# Patient Record
Sex: Female | Born: 1980 | Race: White | Hispanic: No | Smoking: Never smoker
Health system: Southern US, Academic
[De-identification: ages and names within clinical notes are randomized; demographics above are authoritative.]

## PROBLEM LIST (undated history)

## (undated) DIAGNOSIS — G43909 Migraine, unspecified, not intractable, without status migrainosus: Secondary | ICD-10-CM

## (undated) DIAGNOSIS — K760 Fatty (change of) liver, not elsewhere classified: Secondary | ICD-10-CM

## (undated) DIAGNOSIS — J45909 Unspecified asthma, uncomplicated: Secondary | ICD-10-CM

## (undated) DIAGNOSIS — I219 Acute myocardial infarction, unspecified: Secondary | ICD-10-CM

## (undated) DIAGNOSIS — E785 Hyperlipidemia, unspecified: Secondary | ICD-10-CM

## (undated) DIAGNOSIS — I10 Essential (primary) hypertension: Secondary | ICD-10-CM

## (undated) DIAGNOSIS — M1990 Unspecified osteoarthritis, unspecified site: Secondary | ICD-10-CM

## (undated) DIAGNOSIS — E119 Type 2 diabetes mellitus without complications: Secondary | ICD-10-CM

## (undated) DIAGNOSIS — M3500 Sicca syndrome, unspecified: Secondary | ICD-10-CM

## (undated) HISTORY — DX: Migraine, unspecified, not intractable, without status migrainosus: G43.909

## (undated) HISTORY — DX: Hyperlipidemia, unspecified: E78.5

## (undated) HISTORY — PX: HX SHOULDER ARTHROSCOPY: SHX128

## (undated) HISTORY — PX: HX GALL BLADDER SURGERY/CHOLE: SHX55

## (undated) HISTORY — PX: HX TUBAL LIGATION: SHX77

## (undated) HISTORY — DX: Type 2 diabetes mellitus without complications: E11.9

## (undated) HISTORY — DX: Acute myocardial infarction, unspecified: I21.9

## (undated) HISTORY — DX: Essential (primary) hypertension: I10

## (undated) HISTORY — PX: HX TONSILLECTOMY: SHX27

## (undated) HISTORY — DX: Unspecified asthma, uncomplicated: J45.909

## (undated) HISTORY — PX: HX HYSTERECTOMY: SHX81

---
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
## 2012-04-19 ENCOUNTER — Ambulatory Visit (INDEPENDENT_AMBULATORY_CARE_PROVIDER_SITE_OTHER): Payer: Self-pay | Admitting: Rheumatology

## 2012-04-19 NOTE — Telephone Encounter (Signed)
Message copied by Drue Novel on Thu Apr 19, 2012 10:21 AM  ------       Message from: Celene Skeen D       Created: Thu Apr 19, 2012  9:47 AM       Regarding: Records         >> Princella Pellegrini [facility] 04/19/2012 09:47 AM       Dr. Tenna Child,              Per pt she is wanting to make sure you have received her records from Dr. Sherilyn Banker and may call her mother's cell at [phone] if there are any problems.  ------

## 2012-04-23 ENCOUNTER — Encounter (INDEPENDENT_AMBULATORY_CARE_PROVIDER_SITE_OTHER): Payer: Self-pay | Admitting: Rheumatology

## 2012-04-23 NOTE — Progress Notes (Signed)
Records received from [facility] center. Patient notified and confirmed appointment with dr. Tenna Child on 04-25-12.  Honor Loh, LPN [id number], 11:24 AM

## 2012-04-25 ENCOUNTER — Ambulatory Visit (INDEPENDENT_AMBULATORY_CARE_PROVIDER_SITE_OTHER): Payer: Medicaid Other | Admitting: Rheumatology

## 2012-07-26 ENCOUNTER — Encounter (INDEPENDENT_AMBULATORY_CARE_PROVIDER_SITE_OTHER): Payer: Self-pay | Admitting: Rheumatology

## 2012-07-26 ENCOUNTER — Ambulatory Visit (INDEPENDENT_AMBULATORY_CARE_PROVIDER_SITE_OTHER): Payer: Medicaid Other | Admitting: Rheumatology

## 2012-07-26 ENCOUNTER — Ambulatory Visit
Admission: RE | Admit: 2012-07-26 | Discharge: 2012-07-26 | Disposition: A | Discharge status: Home / Self Care | Payer: Medicaid Other | Source: Ambulatory Visit | Attending: Rheumatology | Admitting: Rheumatology

## 2012-07-26 ENCOUNTER — Ambulatory Visit (HOSPITAL_BASED_OUTPATIENT_CLINIC_OR_DEPARTMENT_OTHER): Payer: Medicaid Other | Admitting: Rheumatology

## 2012-07-26 VITALS — BP 133/98 | HR 90 | Temp 97.4°F | Ht 69.53 in | Wt 246.5 lb

## 2012-07-26 DIAGNOSIS — R894 Abnormal immunological findings in specimens from other organs, systems and tissues: Secondary | ICD-10-CM | POA: Insufficient documentation

## 2012-07-26 DIAGNOSIS — M2550 Pain in unspecified joint: Secondary | ICD-10-CM | POA: Insufficient documentation

## 2012-07-26 DIAGNOSIS — G8929 Other chronic pain: Secondary | ICD-10-CM | POA: Insufficient documentation

## 2012-07-26 DIAGNOSIS — M25519 Pain in unspecified shoulder: Secondary | ICD-10-CM | POA: Insufficient documentation

## 2012-07-26 LAB — URINALYSIS, MACROSCOPIC AND MICROSCOPIC
BILIRUBIN: NEGATIVE
BLOOD: NEGATIVE
GLUCOSE: 30 mg/dL — AB
KETONES: NEGATIVE mg/dL
LEUKOCYTES: NEGATIVE
NITRITE: NEGATIVE
PH URINE: 5 (ref 5.0–8.0)
RBC'S: <1 /HPF (ref 0–4)
SPECIFIC GRAVITY, URINE: 1.021 (ref 1.005–1.030)
UROBILINOGEN: NORMAL mg/dL
WBC'S: 1 /HPF (ref 0–6)

## 2012-07-26 LAB — COMPREHENSIVE METABOLIC PANEL, NON-FASTING
ALBUMIN: 4 g/dL (ref 3.5–4.8)
ALKALINE PHOSPHATASE: 67 U/L (ref 38–126)
ALT (SGPT): 13 U/L (ref 7–45)
ANION GAP: 10 mmol/L (ref 5–16)
AST (SGOT): 13 U/L (ref 8–41)
BILIRUBIN, TOTAL: 0.5 mg/dL (ref 0.3–1.3)
BUN/CREAT RATIO: 17 (ref 6–22)
BUN: 11 mg/dL (ref 6–20)
CALCIUM: 9.5 mg/dL (ref 8.5–10.4)
CARBON DIOXIDE: 26 mmol/L (ref 22–32)
CHLORIDE: 103 mmol/L (ref 96–111)
CREATININE: 0.64 mg/dL (ref 0.49–1.10)
ESTIMATED GLOMERULAR FILTRATION RATE: >59 ml/min/1.73m2 (ref >59)
GLUCOSE,NONFAST: 140 mg/dL — ABNORMAL HIGH (ref 65–139)
POTASSIUM: 3.8 mmol/L (ref 3.5–5.1)
SODIUM: 139 mmol/L (ref 136–145)
TOTAL PROTEIN: 7.7 g/dL (ref 6.4–8.3)

## 2012-07-26 LAB — SEDIMENTATION RATE: SEDIMENTATION RATE: 25 mm/hr — ABNORMAL HIGH (ref 0–20)

## 2012-07-26 LAB — CBC/DIFF
BASOPHILS: 1 %
BASOS ABS: 0.085 THOU/uL (ref 0.0–0.2)
EOS ABS: 0.26 10*3/uL (ref 0.0–0.5)
EOSINOPHIL: 2 %
HCT: 37.3 % (ref 33.5–45.2)
HGB: 11.9 g/dL (ref 11.2–15.2)
LYMPHOCYTES: 32 %
LYMPHS ABS: 3.997 10*3/uL (ref 1.0–4.8)
MCH: 25.4 pg — ABNORMAL LOW (ref 27.4–33.0)
MCHC: 32 g/dL — ABNORMAL LOW (ref 32.5–35.8)
MCV: 79.4 fL (ref 78–100)
MONOCYTES: 6 %
MONOS ABS: 0.748 10*3/uL (ref 0.3–1.0)
MPV: 7.8 fL (ref 7.5–11.5)
PLATELET COUNT: 394 THOU/uL (ref 140–450)
PMN ABS: 7.277 THOU/uL (ref 1.5–7.7)
PMN'S: 59 %
RBC: 4.7 MIL/uL (ref 3.63–4.92)
RDW: 15.4 % — ABNORMAL HIGH (ref 12.0–15.0)
WBC: 12.4 10*3/uL — ABNORMAL HIGH (ref 3.5–11.0)

## 2012-07-27 LAB — C4 COMPLEMENT, SERUM: C4: 35 mg/dL (ref 12–39)

## 2012-07-27 LAB — C3 COMPLEMENT, SERUM: C3: 204 mg/dL — ABNORMAL HIGH (ref 81–157)

## 2012-07-27 LAB — HEP-2 SUBSTRATE ANTINUCLEAR ANTIBODIES (ANA), SERUM: ANTI-NUCLEAR AB: NEGATIVE

## 2012-07-28 LAB — DNA DOUBLE STRANDED (DSDNA) ANTIBODIES WITH REFLEX, IGG, SERUM: DNA DOUBLE-STRANDED AB, IGG: <12.3

## 2012-07-28 LAB — RNP ANTIBODIES, IGG, SERUM: RNP ANTIBODIES, IGG, SERUM: <0.2

## 2012-07-28 LAB — SM (SMITH) ANTIBODIES, IGG, SERUM: SM (SMITH) ANTIBODIES, IGG, SERUM: <0.2

## 2012-07-28 LAB — CYCLIC CITRULLINATED PEPTIDE ANTIBODIES, IGG, SERUM: CYCLIC CITRULLINATED PEPTIDE ANTIBODIES, IGG, SERUM: <15.6

## 2012-07-28 LAB — SS-A AND SS-B ANTIBODIES, IGG, SERUM
SS-A/RO ANTIBODIES, IGG, SERUM: <0.2
SS-B/LA ANTIBODIES, IGG, SERUM: 2.6 — ABNORMAL HIGH

## 2012-07-30 LAB — RHEUMATOID FACTOR, SERUM: RHEUMATOID FACTOR, SERUM: <15

## 2012-08-02 ENCOUNTER — Encounter (INDEPENDENT_AMBULATORY_CARE_PROVIDER_SITE_OTHER): Payer: Self-pay | Admitting: Surgical

## 2012-08-02 ENCOUNTER — Telehealth (INDEPENDENT_AMBULATORY_CARE_PROVIDER_SITE_OTHER): Payer: Self-pay | Admitting: Surgical

## 2012-08-02 ENCOUNTER — Ambulatory Visit (INDEPENDENT_AMBULATORY_CARE_PROVIDER_SITE_OTHER): Payer: Self-pay | Admitting: Surgical

## 2012-08-02 NOTE — Telephone Encounter (Signed)
 Message copied by Shean Gerding JO on Thu Aug 02, 2012  2:18 PM  ------       Message from: [place], ARIZONA MARIE       Created: Thu Aug 02, 2012 12:00 PM         >> MONT HASTE 08/02/2012 11:55 AM       Mirka Barbone       Pt is calling back stated she spoke with you earlier regarding her test results but she forgot to ask why you think her legs are swelling.Please call pt back [phone]  ------

## 2012-08-02 NOTE — Telephone Encounter (Signed)
Called patient at home number  Left message I called     Called mobile number   Gave results  Positive SSB   Would rec minor salivary lip biopsy to further evaluate for possible sjogrens, but negative SSA and ANA  Had trace protein in urine - states has had before was told due to diabetes  Want to have referral to EN[facility] closer to home  Will send PCP letter with results and recommendations  Otherwise, this doe not represent lupus or RA   She understands

## 2012-08-02 NOTE — Telephone Encounter (Signed)
 Please call patient back  She needs to follow up with her PCP  I cannot give her a rheumatologic cause of her leg swelling  Thank you

## 2012-08-02 NOTE — Telephone Encounter (Signed)
Called pt and relayed Holly's message. Pt will follow up with PCP. Instructed pt to call with any further questions or concerns.

## 2012-08-06 ENCOUNTER — Ambulatory Visit (INDEPENDENT_AMBULATORY_CARE_PROVIDER_SITE_OTHER): Payer: Medicaid Other | Admitting: Rheumatology

## 2016-04-25 ENCOUNTER — Ambulatory Visit (INDEPENDENT_AMBULATORY_CARE_PROVIDER_SITE_OTHER): Payer: Medicaid Other | Admitting: Anesthesiology

## 2016-05-09 ENCOUNTER — Ambulatory Visit (INDEPENDENT_AMBULATORY_CARE_PROVIDER_SITE_OTHER): Payer: Medicaid Other | Admitting: Family

## 2016-05-31 ENCOUNTER — Ambulatory Visit (INDEPENDENT_AMBULATORY_CARE_PROVIDER_SITE_OTHER): Payer: Medicaid Other | Admitting: Family

## 2018-06-29 IMAGING — US ABD LIMITED
1 series · 14 of 25 positions shown · non-contrast
Comparison: 11/22/2016.

EXAM:  TOMOY[place]I PROFESSIONAL READ ABD U/S LMTD
INDICATION: K 76.0.

[Series 1: abd limited · 14 of 50 slices shown]
[im 1/50]
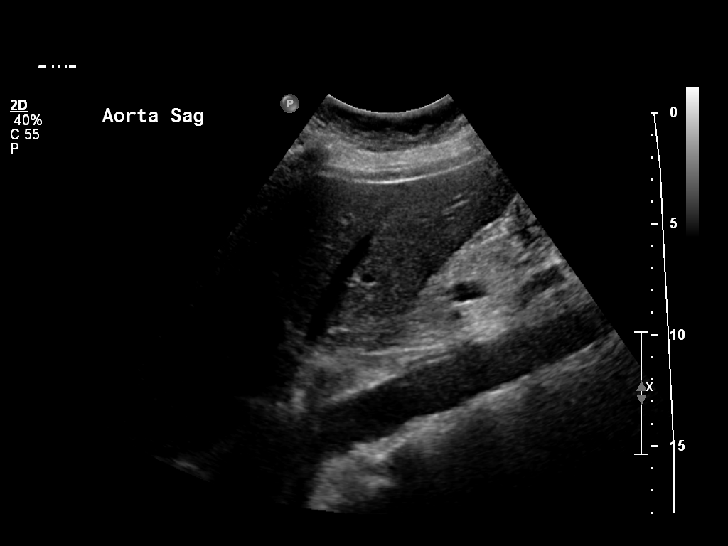
[im 5/50]
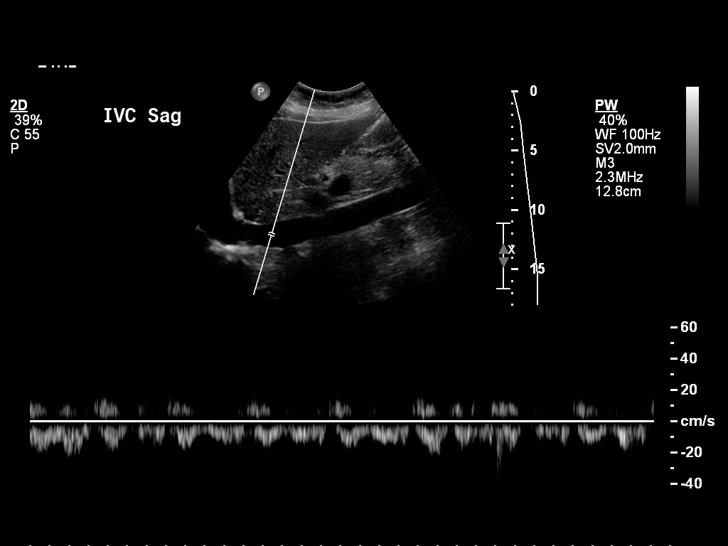
[im 9/50]
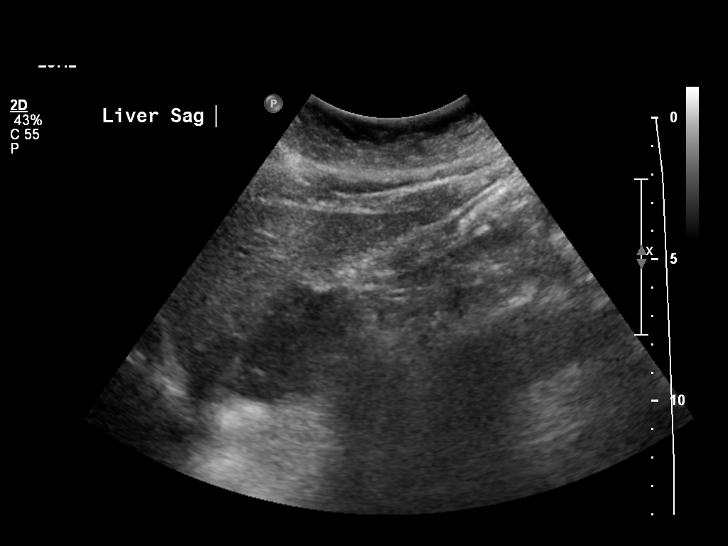
[im 13/50]
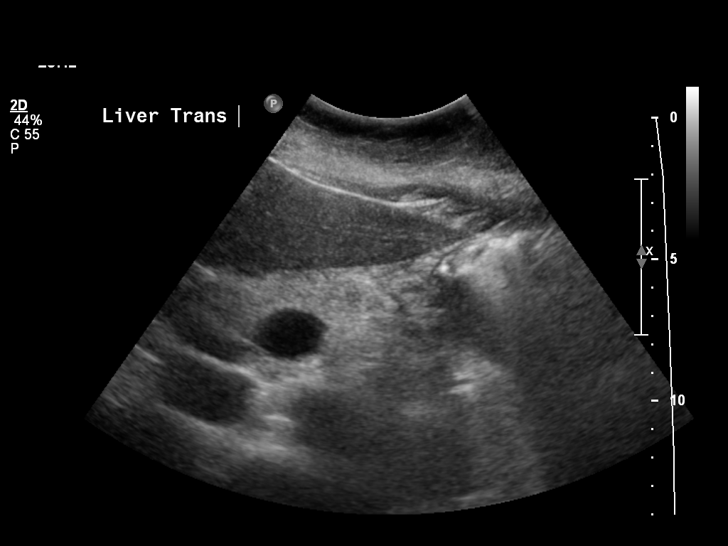
[im 17/50]
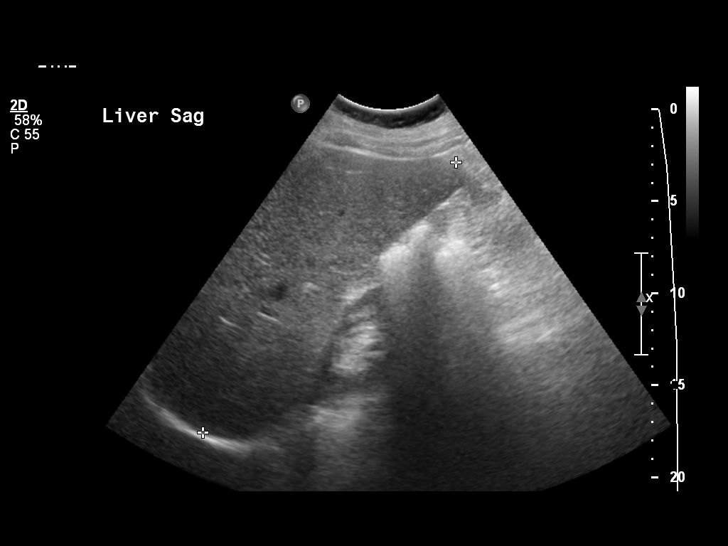
[im 19/50]
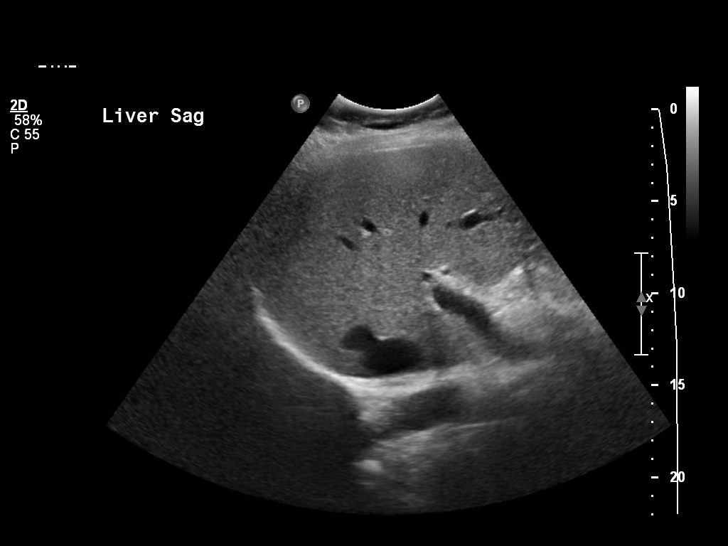
[im 23/50]
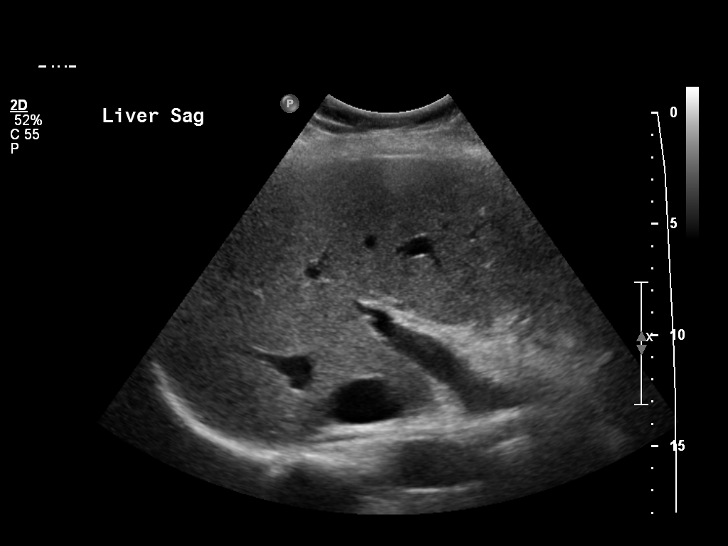
[im 27/50]
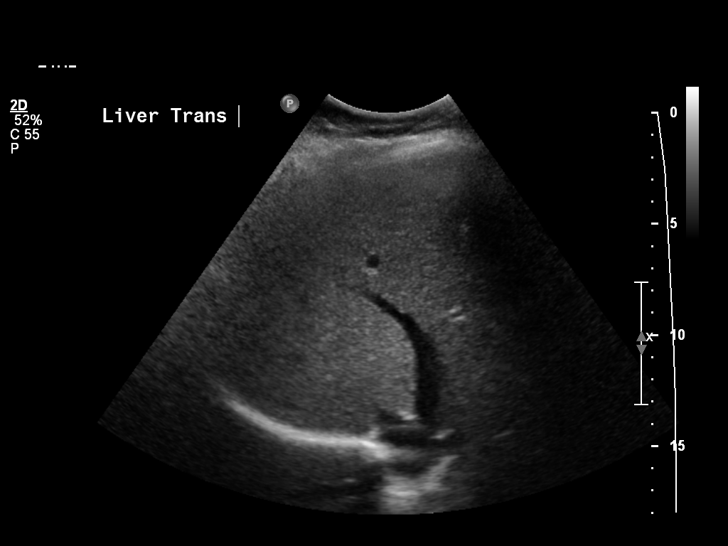
[im 31/50]
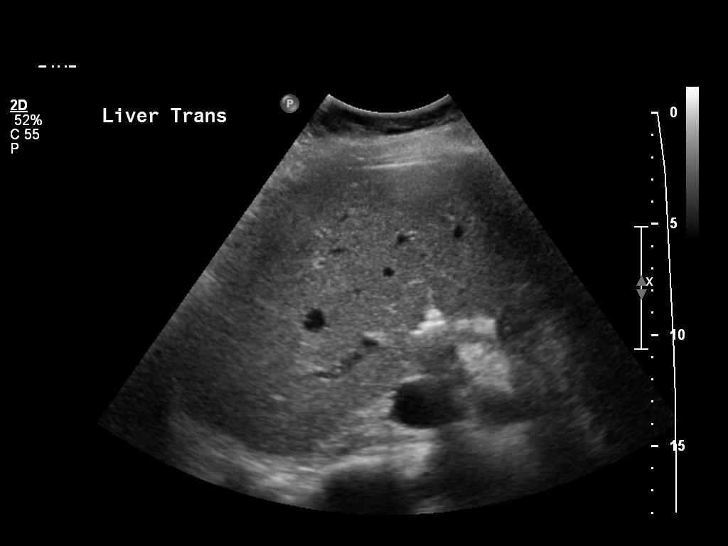
[im 33/50]
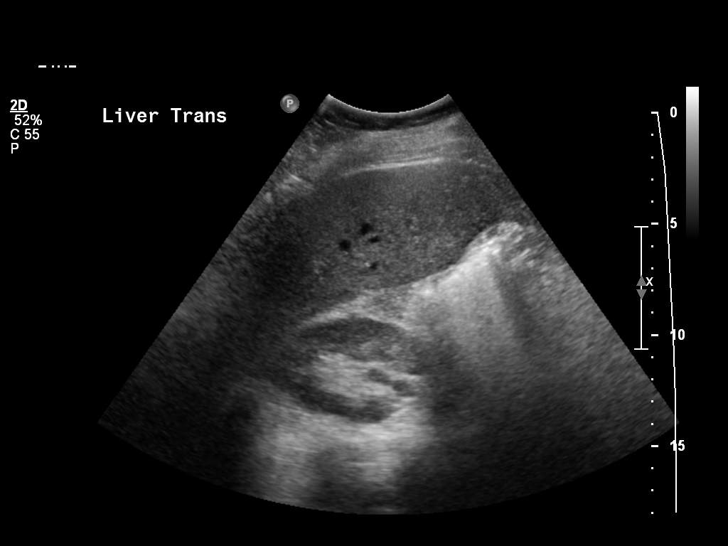
[im 37/50]
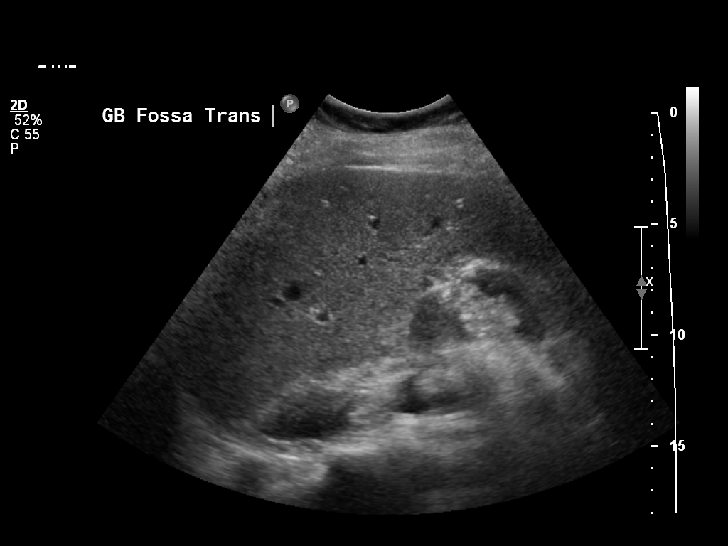
[im 41/50]
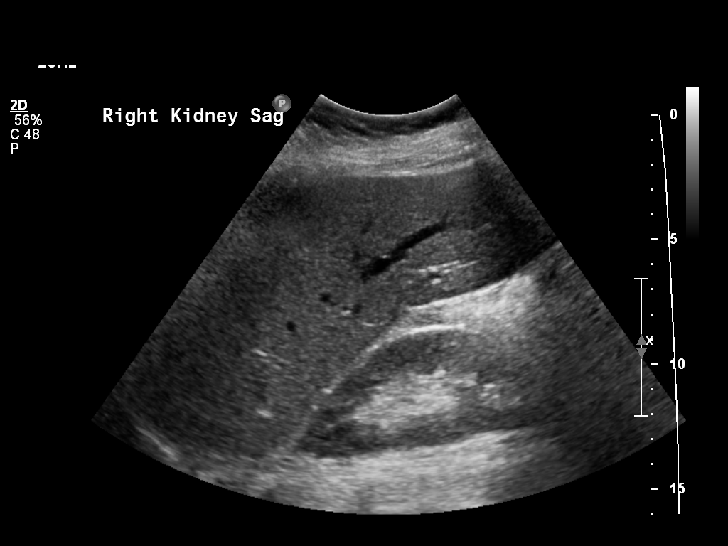
[im 45/50]
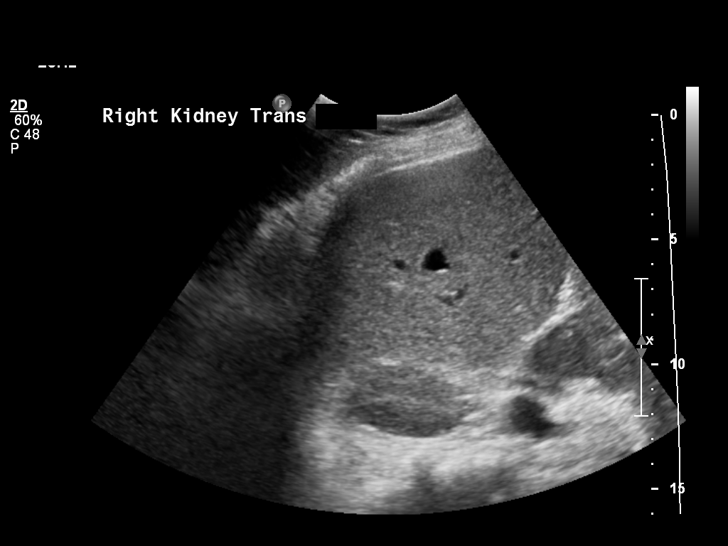
[im 50/50]
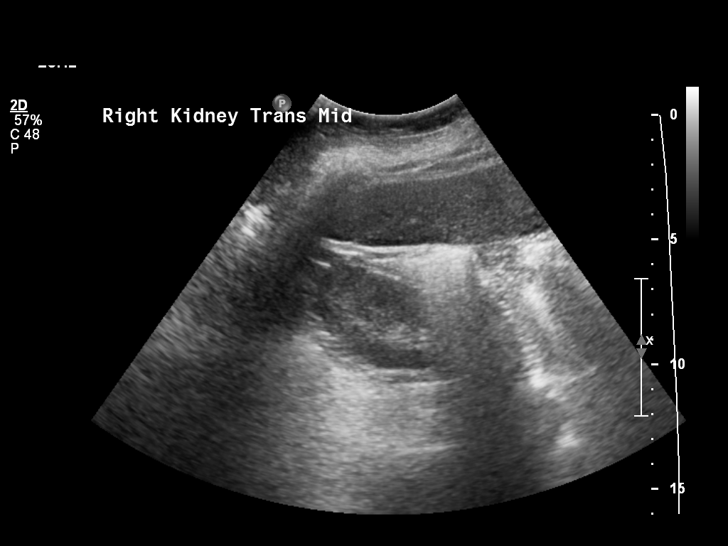

[14 of 25 positions shown; findings below may reference images not displayed]

FINDINGS: Liver is fatty and enlarged measuring 20 cm in maximum sagittal dimension. Fatty infiltration limits evaluation for focal hepatic mass. There is no intrahepatic biliary ductal dilatation. Common bile duct measures 7 mm, likely due to prior cholecystectomy. Pancreas is incompletely visualized due to artifact from overlying bowel gas. Right kidney measures 11.5 cm and is normal.

Visualized abdominal aorta is without aneurysmal dilatation. IVC is normal. Portal vein measures 13 mm in diameter and demonstrates hepatopetal flow. Hepatic veins are also patent. There is no ascites.
IMPRESSION: 1. Fatty and enlarged liver. 

2. Prior cholecystectomy. 

3. Pancreas incompletely visualized due to artifact from overlying bowel gas.

## 2019-03-11 IMAGING — US ABD LIMITED
1 series · 14 of 25 positions shown · non-contrast
Comparison: 06/29/2018.

EXAM:  BILLIE PROFESSIONAL READ ABD U/S LMTD
INDICATION: Fatty liver.

[Series 1: abd limited · 14 of 48 slices shown]
[im 1/48]
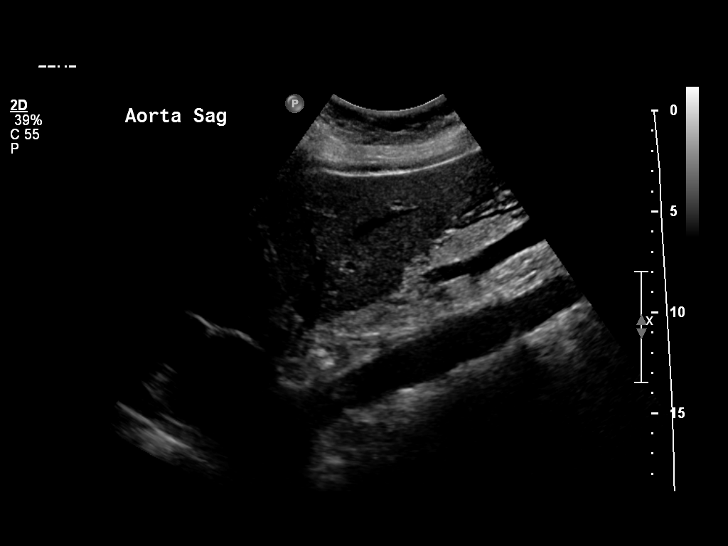
[im 4/48]
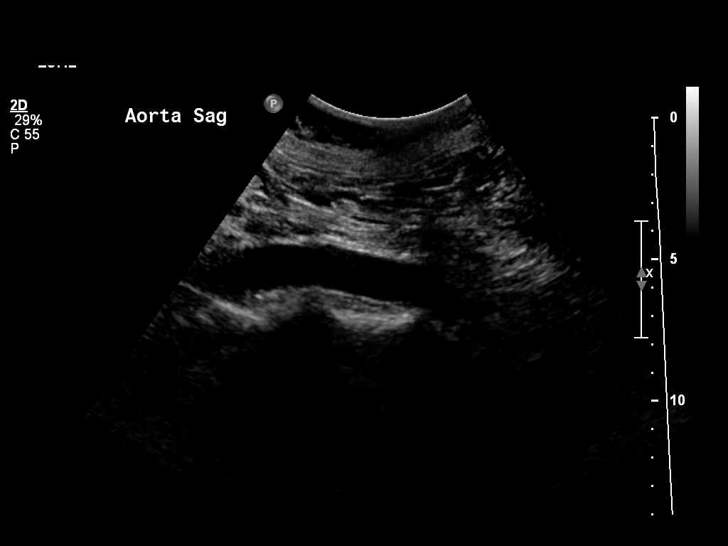
[im 8/48]
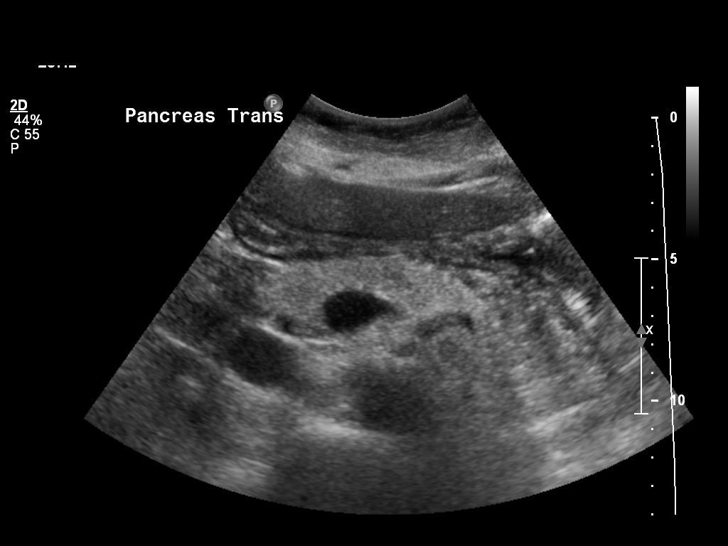
[im 12/48]
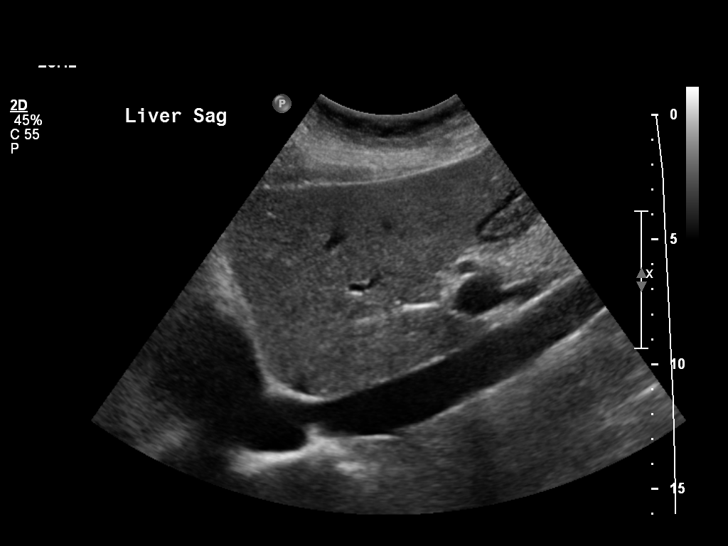
[im 16/48]
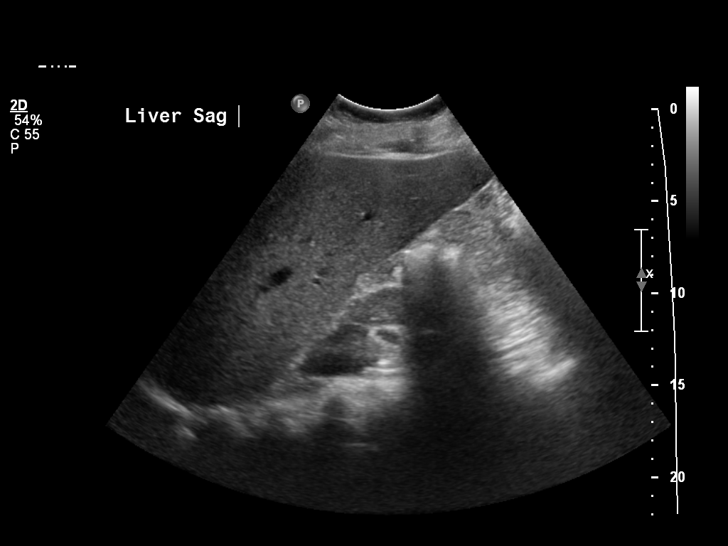
[im 18/48]
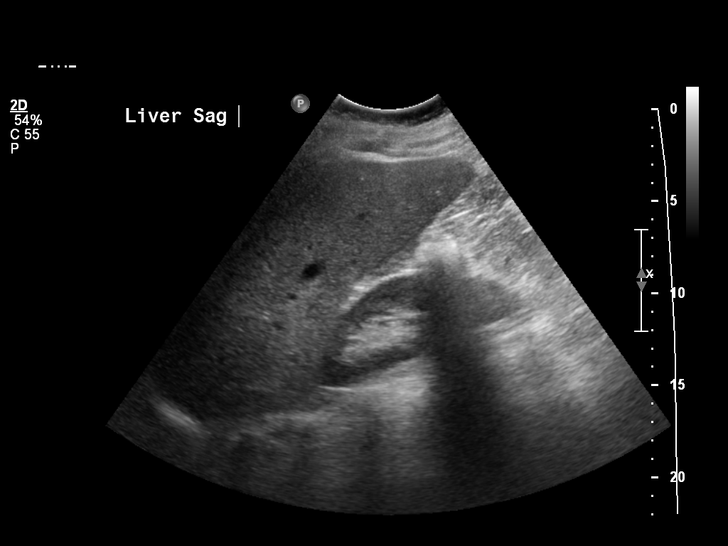
[im 22/48]
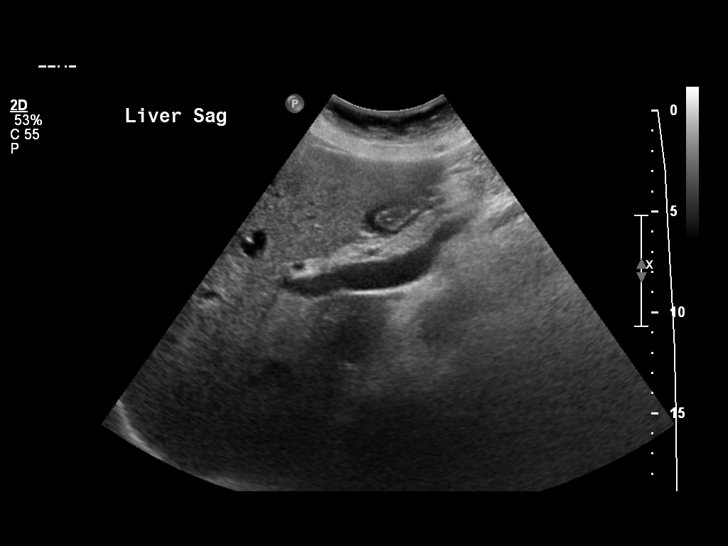
[im 26/48]
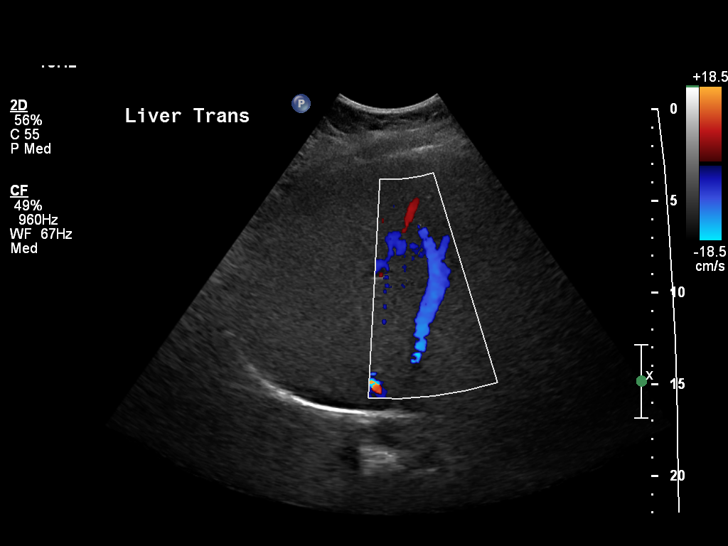
[im 30/48]
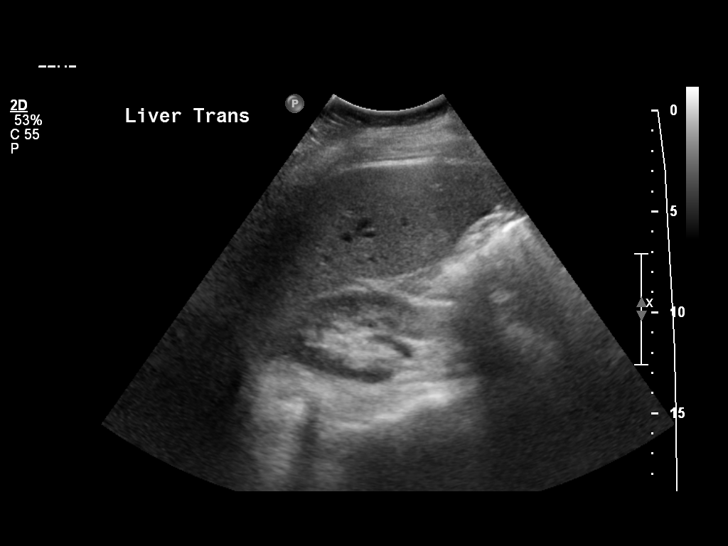
[im 32/48]
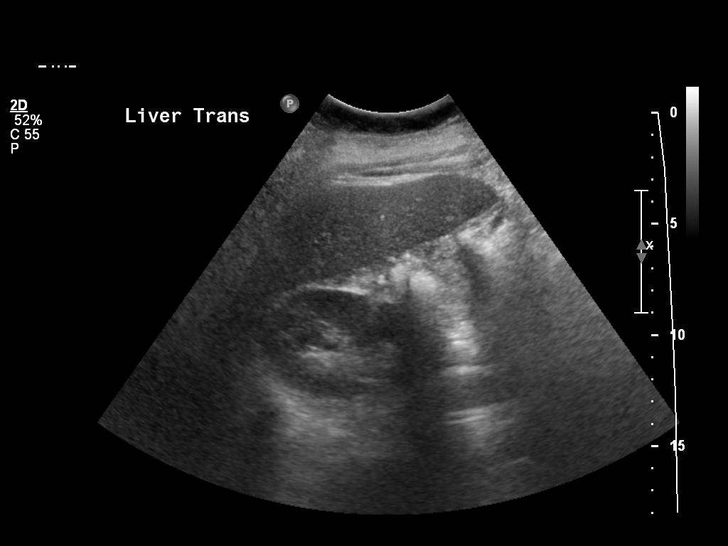
[im 36/48]
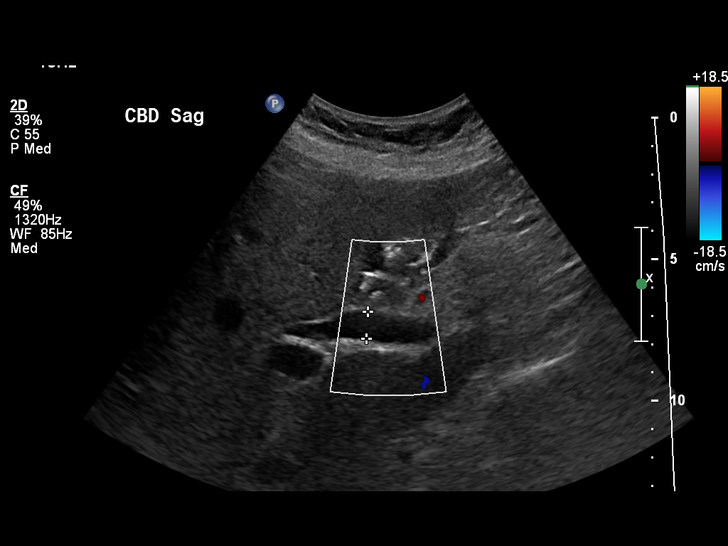
[im 40/48]
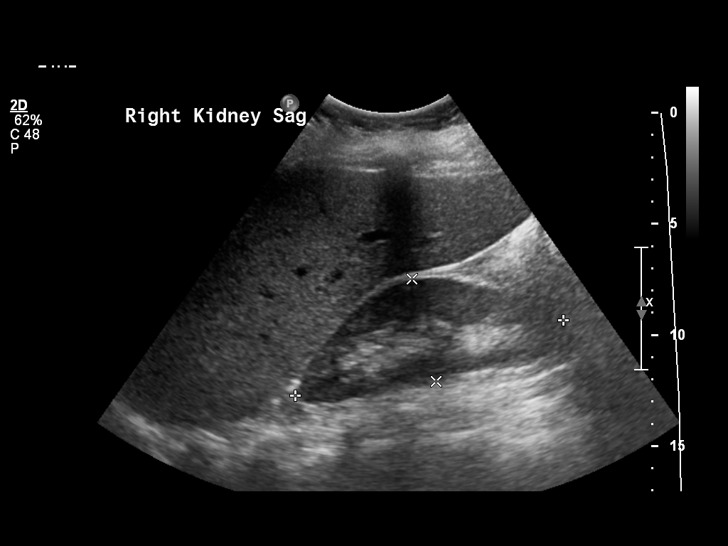
[im 44/48]
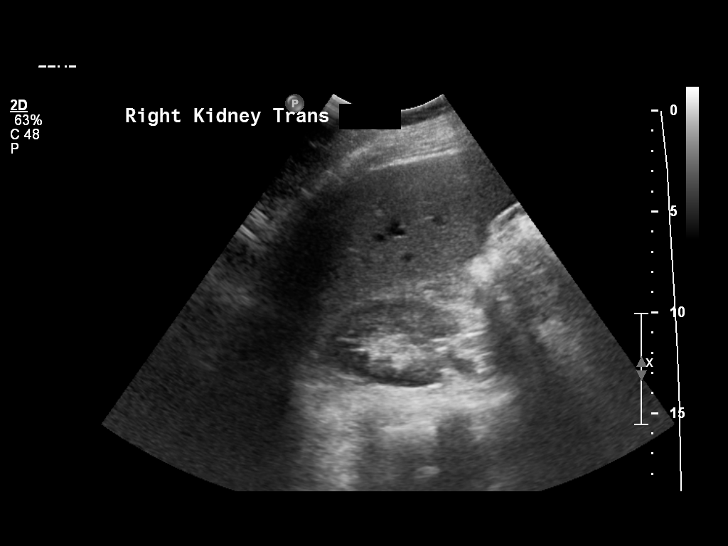
[im 48/48]
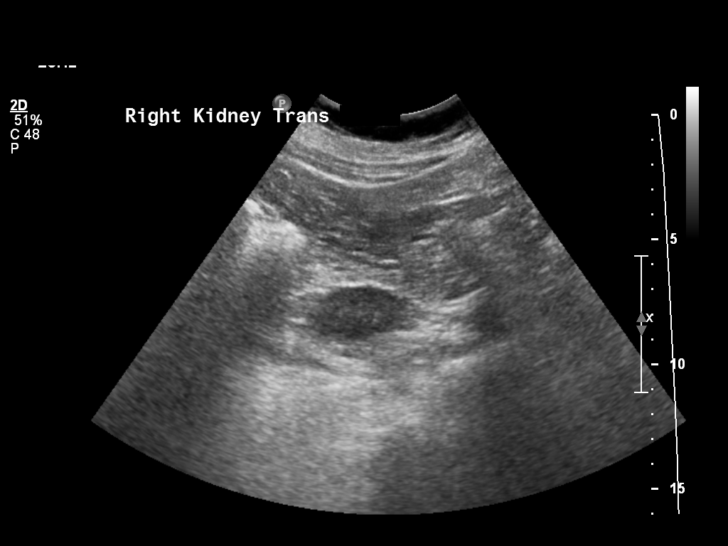

[14 of 25 positions shown; findings below may reference images not displayed]

FINDINGS: Liver is fatty and enlarged measuring 20.5 cm in maximum sagittal dimension. Fatty infiltration limits evaluation for focal hepatic mass. There is no intrahepatic biliary ductal dilatation. Common bile duct measures 9.5 mm, likely due to prior cholecystectomy. Pancreas is incompletely visualized due to artifact from overlying bowel gas. Right kidney measures 12.5 cm and is normal.

Visualized abdominal aorta is without aneurysmal dilatation. IVC is normal. Portal vein measures 13 mm in diameter and demonstrates hepatopetal flow. Hepatic veins are also patent. There is no ascites.
IMPRESSION: 1. Fatty and enlarged liver. 

2. Prior cholecystectomy. 

3. Pancreas incompletely visualized due to artifact from overlying bowel gas.

## 2019-08-30 IMAGING — US ABD LIMITED
1 series · 14 of 25 positions shown · non-contrast
Comparison: 03/11/2019.

EXAM:  CLEMENT[place] PROFESSIONAL READ ABD U/S LMTD
INDICATION: Fatty liver.

[Series 1: abd limited · 14 of 51 slices shown]
[im 1/51]
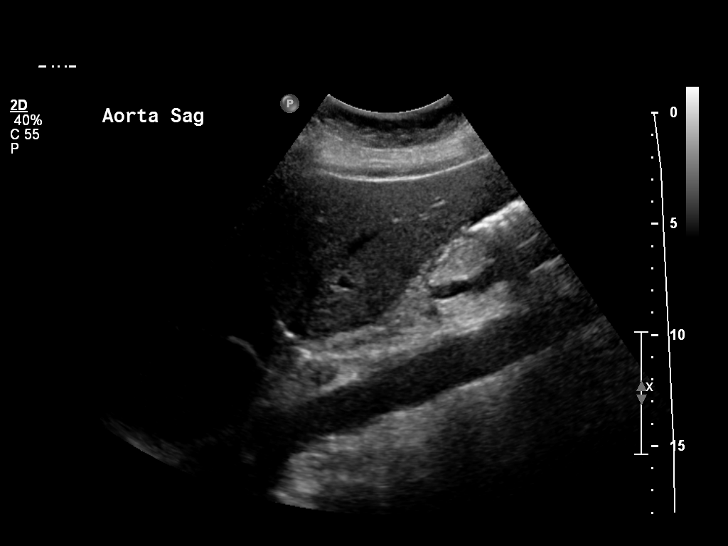
[im 5/51]
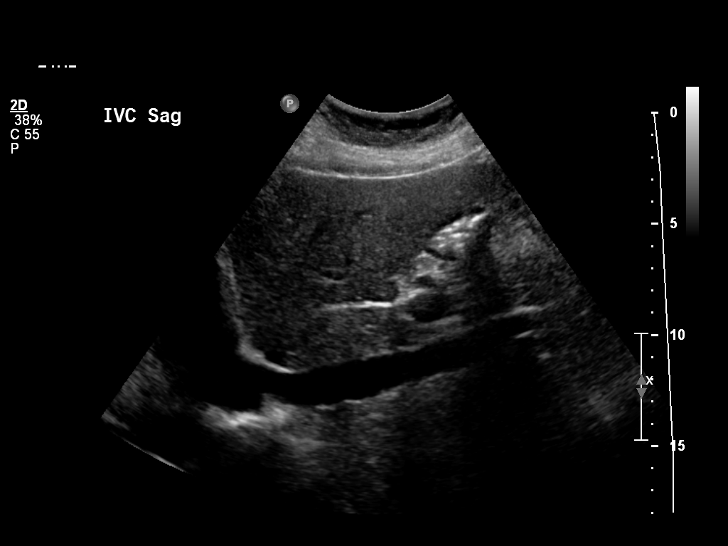
[im 9/51]
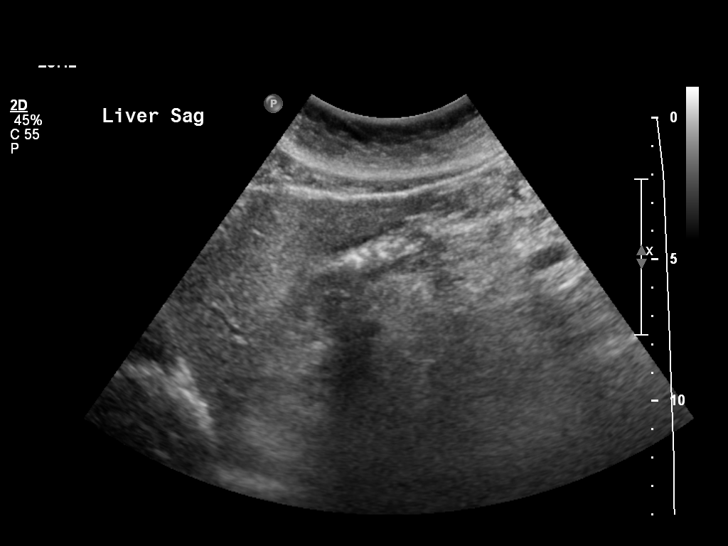
[im 13/51]
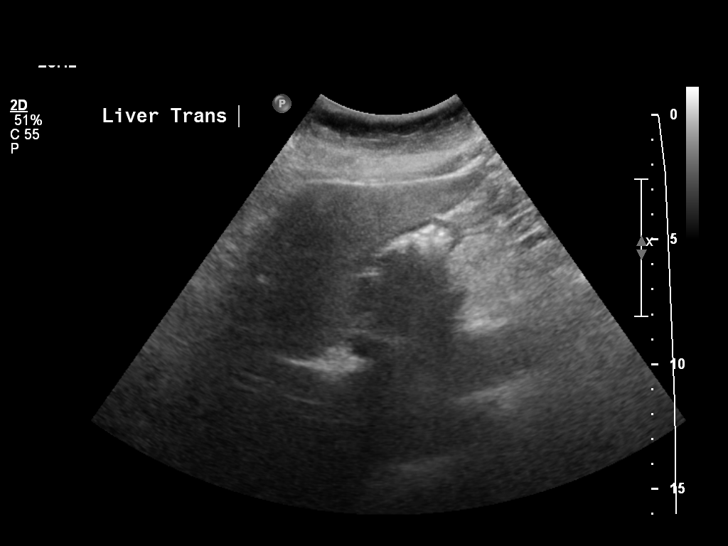
[im 17/51]
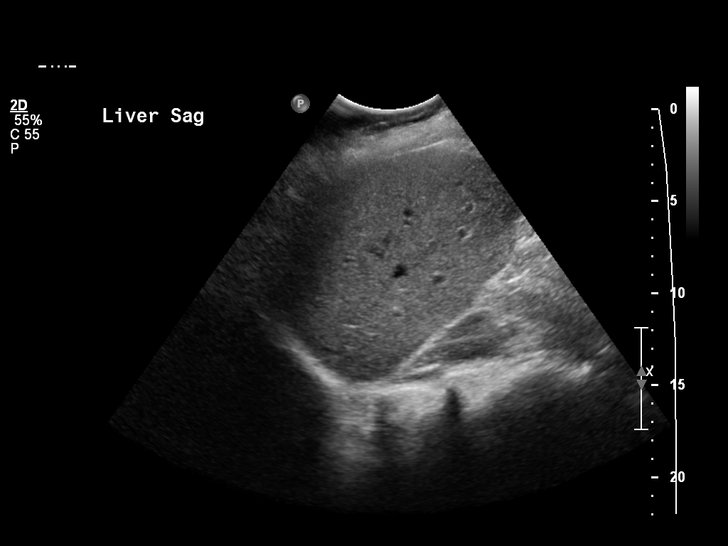
[im 19/51]
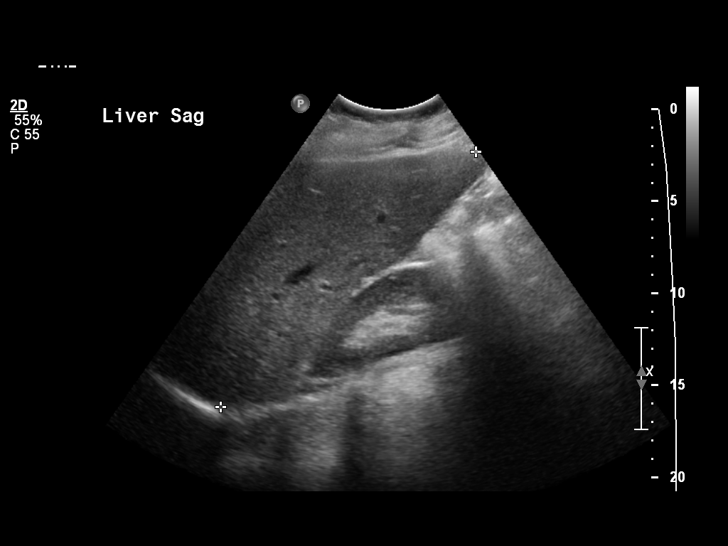
[im 23/51]
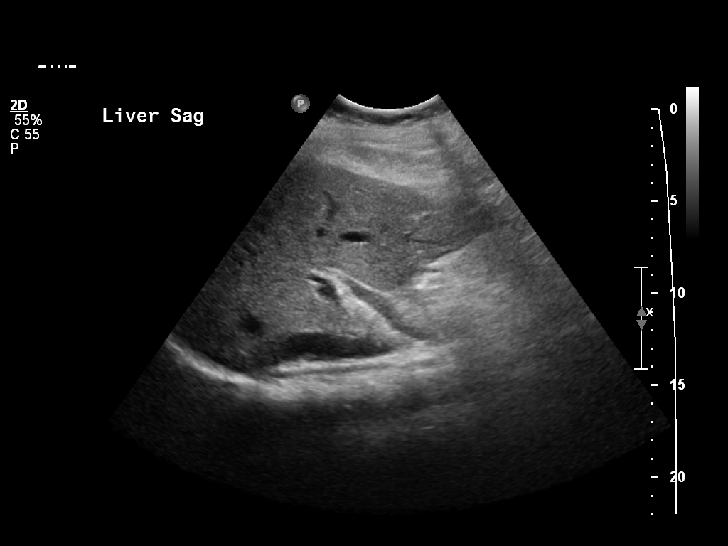
[im 28/51]
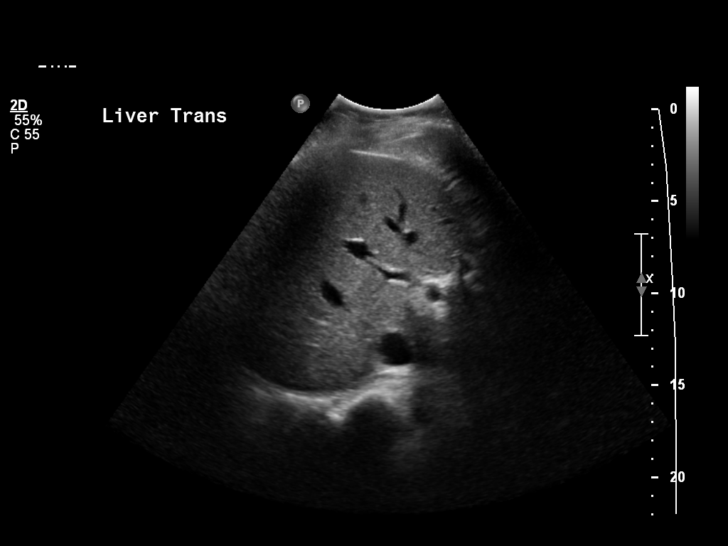
[im 32/51]
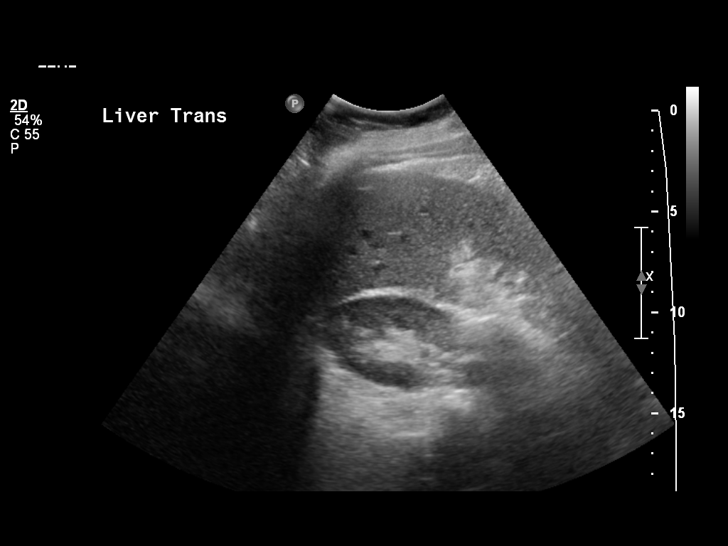
[im 34/51]
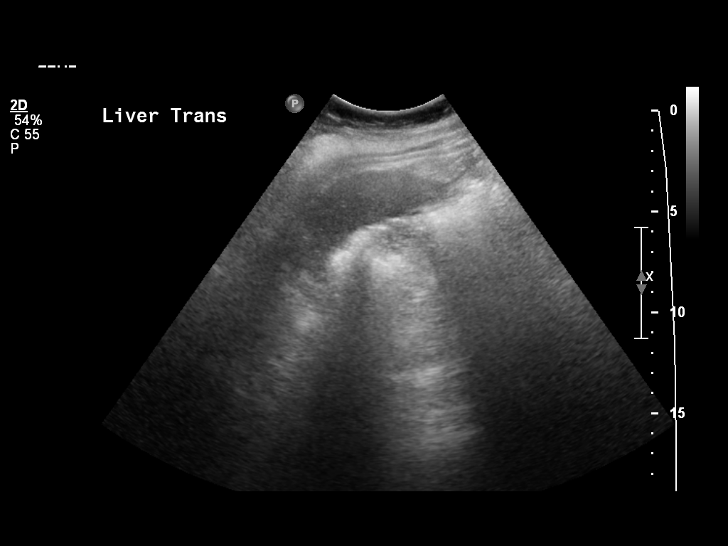
[im 38/51]
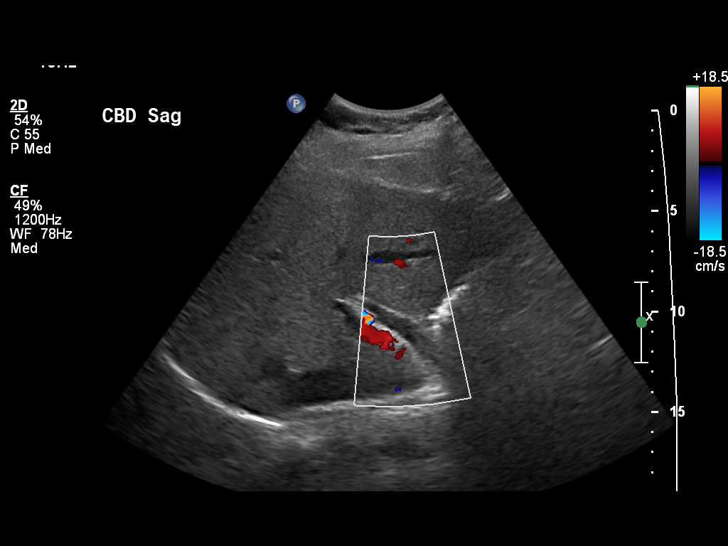
[im 42/51]
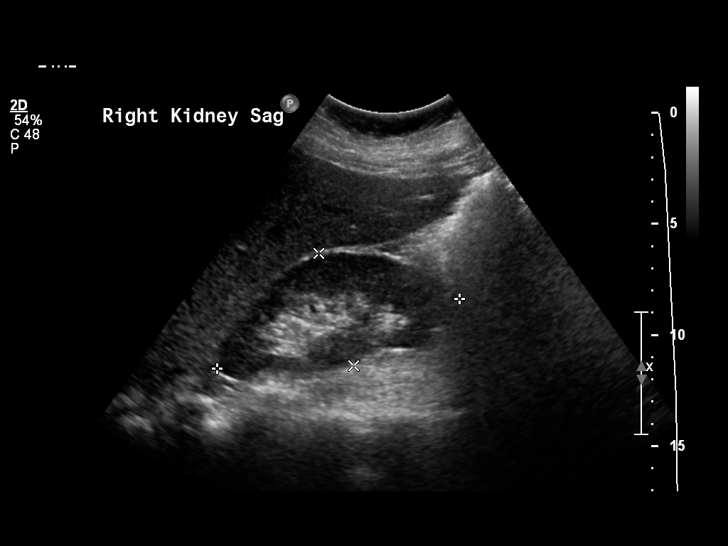
[im 46/51]
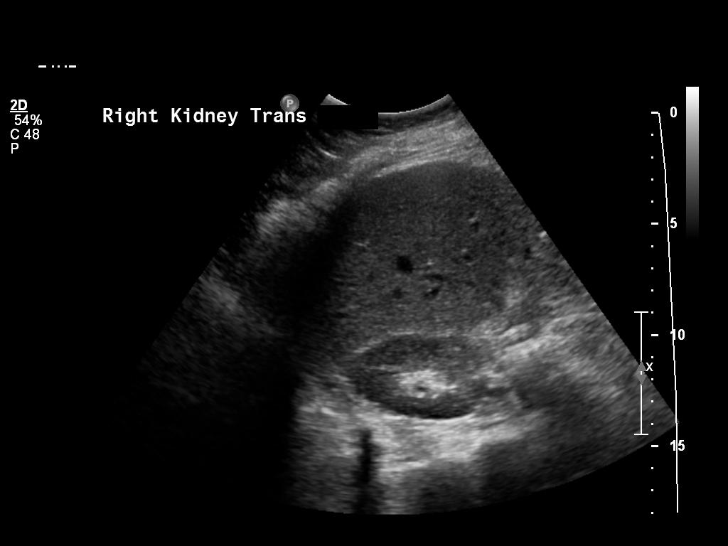
[im 51/51]
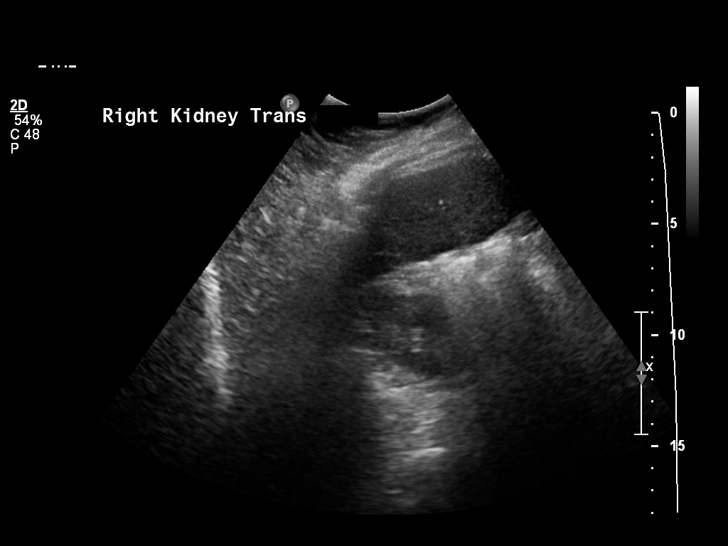

[14 of 25 positions shown; findings below may reference images not displayed]

FINDINGS: Liver is fatty and enlarged measuring 19.5 cm in maximum sagittal dimension. Fatty infiltration limits evaluation for focal hepatic mass. There is no intra or extrahepatic biliary ductal dilatation. Common bile duct measures 6.5 mm. Gallbladder is surgically absent. Pancreas is incompletely visualized due to artifact from overlying bowel gas. Right kidney measures 11.5 cm and is normal.

Visualized abdominal aorta is without aneurysmal dilatation. IVC is normal. Portal vein measures 13 mm in diameter and demonstrates hepatopetal flow. Hepatic veins are also patent. There is no ascites.
IMPRESSION: 1. Fatty and enlarged liver. 

2. Prior cholecystectomy. 

3. Pancreas incompletely visualized due to artifact from overlying bowel gas.

## 2020-03-10 IMAGING — US ABD LIMITED
1 series · 14 of 25 positions shown · non-contrast
Comparison: 08/30/2019.

﻿EXAM:  SOMEKA[place] PROFESSIONAL READ ABD U/S LMTD
INDICATION: Fatty liver.

[Series 1: abd limited · 14 of 50 slices shown]
[im 1/50]
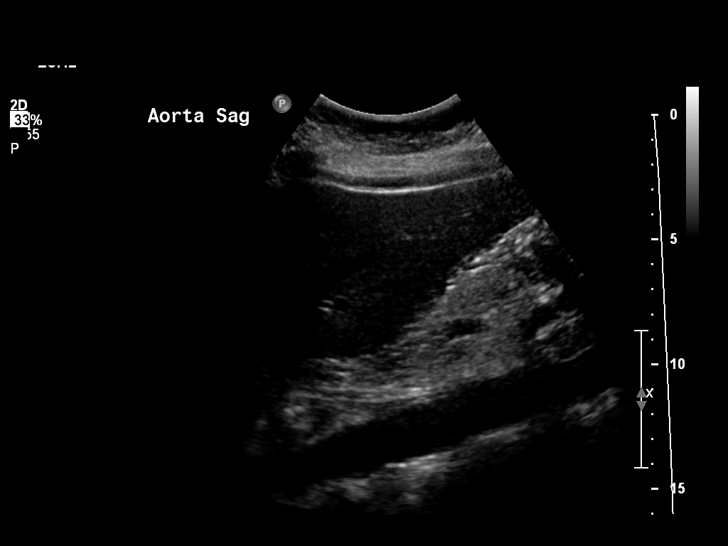
[im 5/50]
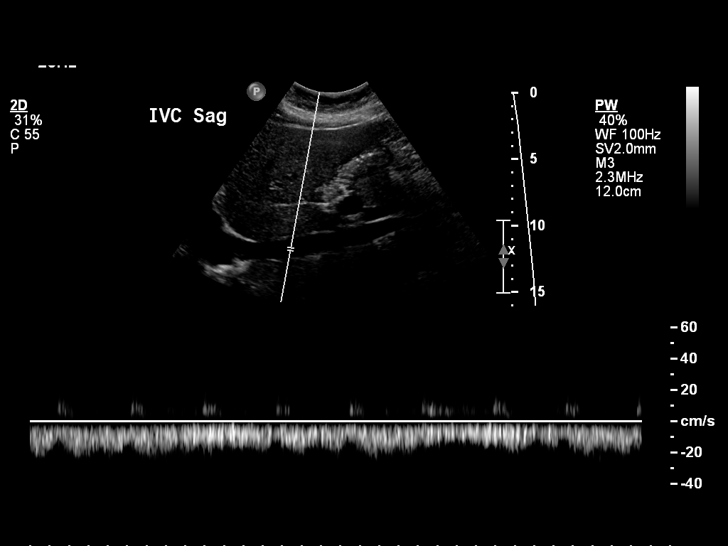
[im 9/50]
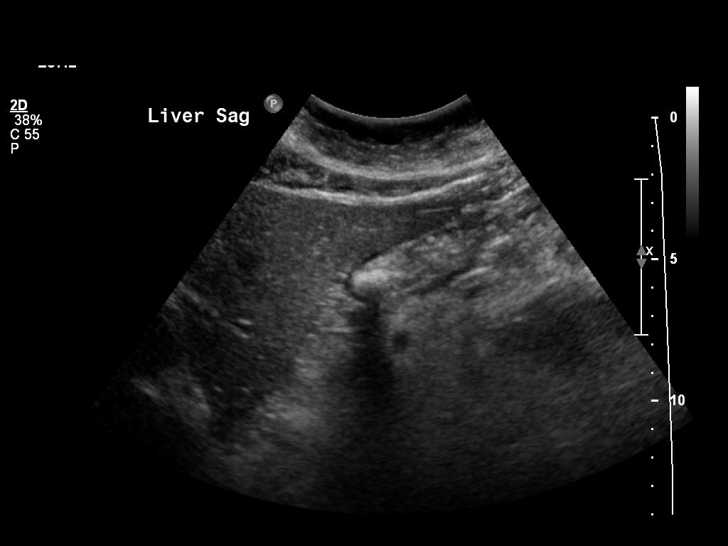
[im 13/50]
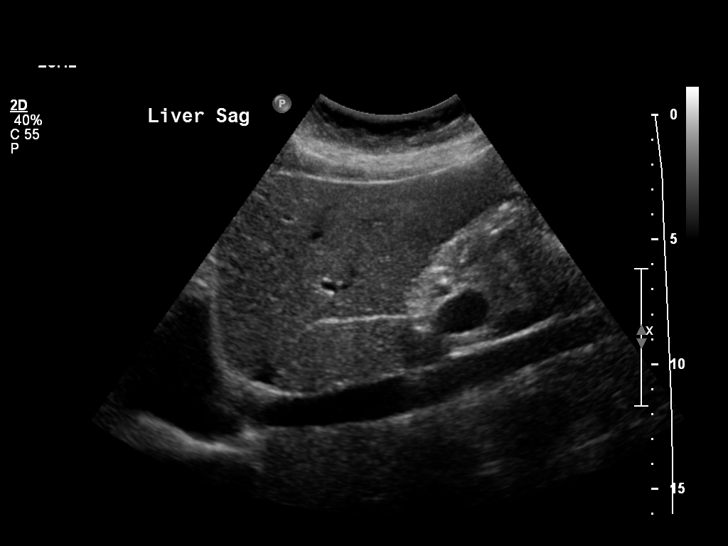
[im 17/50]
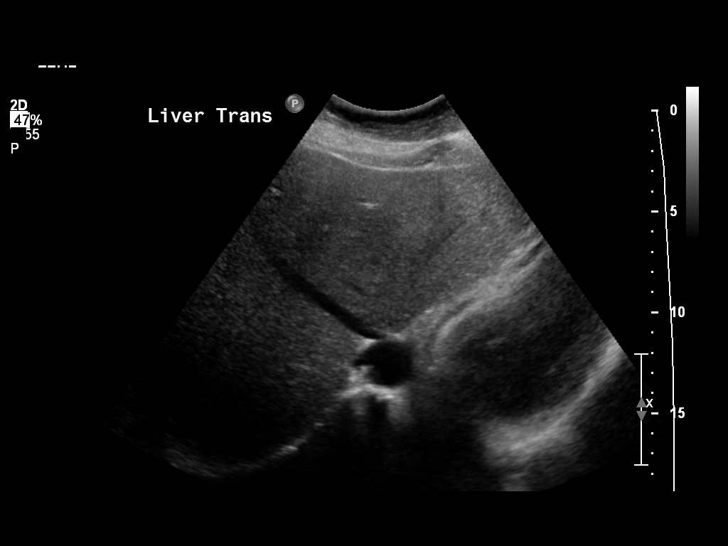
[im 19/50]
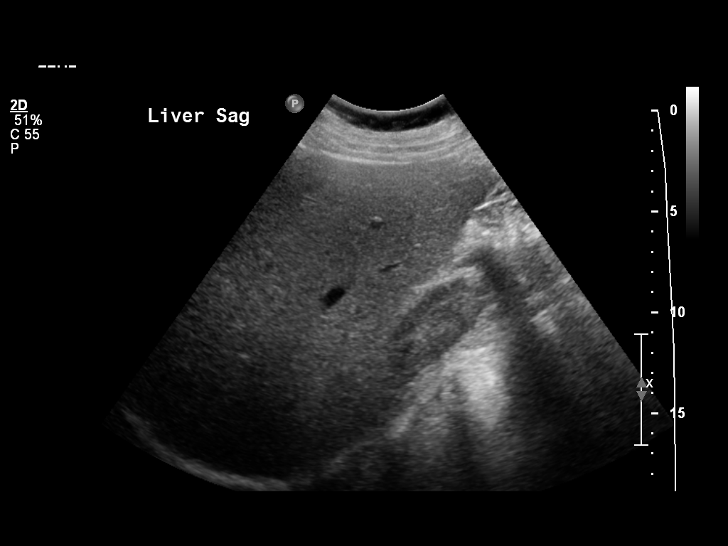
[im 23/50]
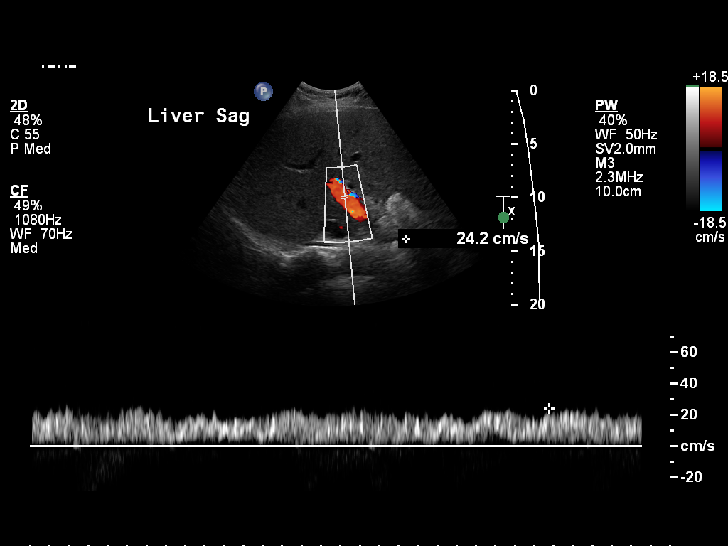
[im 27/50]
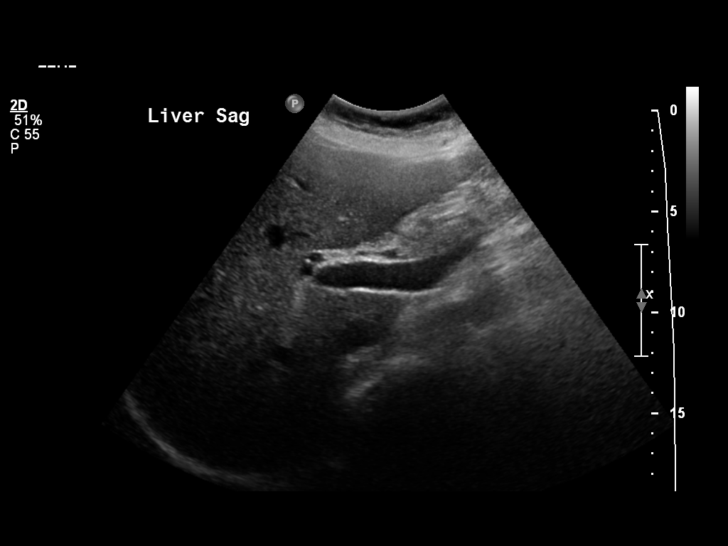
[im 31/50]
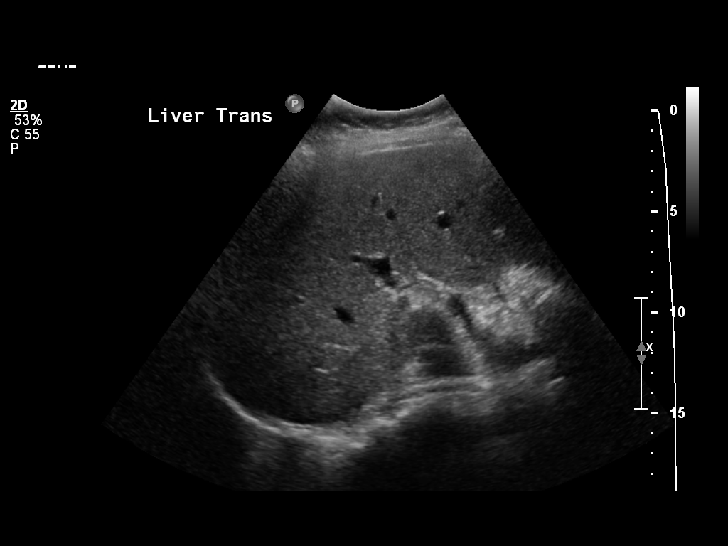
[im 33/50]
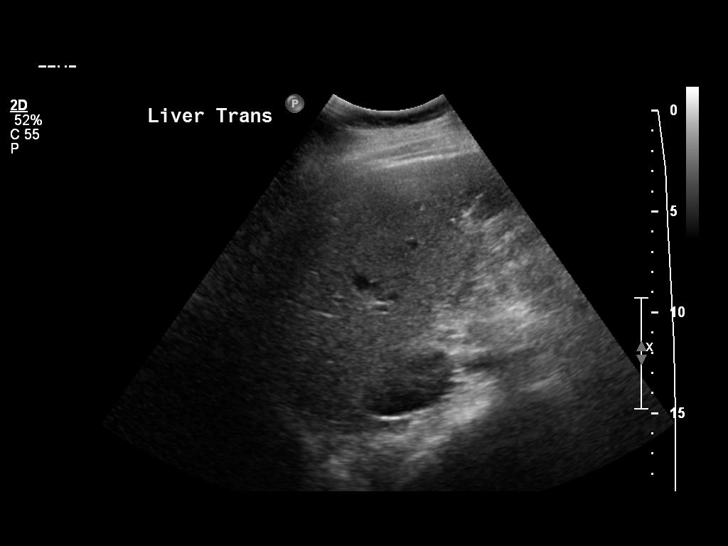
[im 37/50]
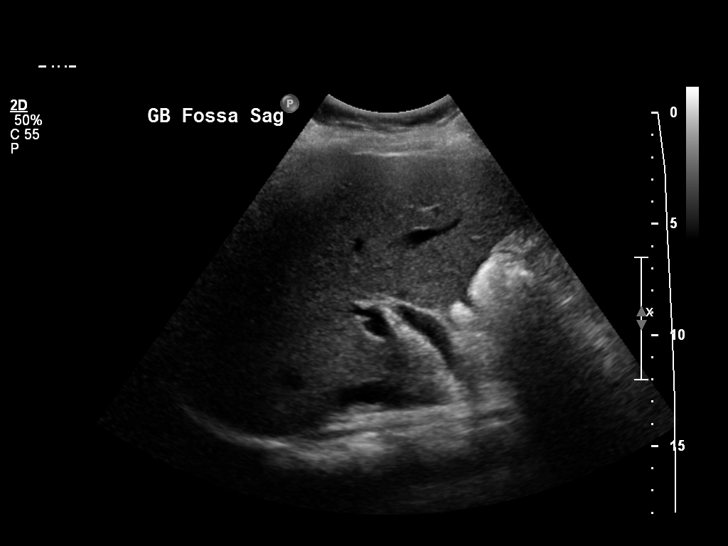
[im 41/50]
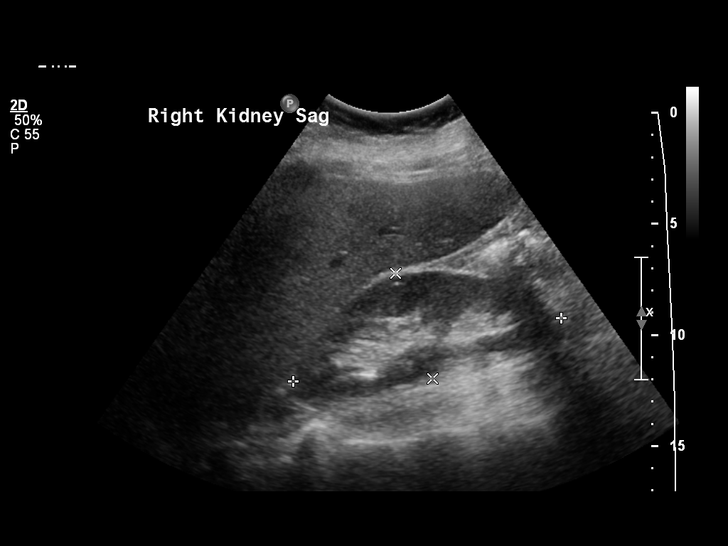
[im 45/50]
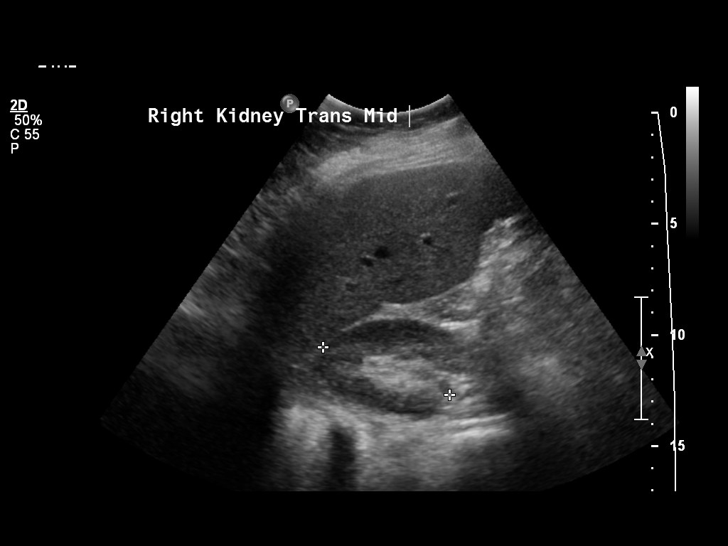
[im 50/50]
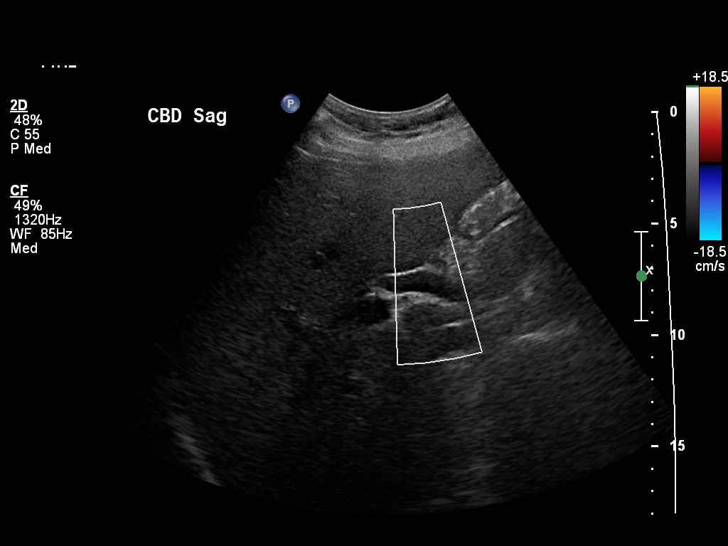

[14 of 25 positions shown; findings below may reference images not displayed]

FINDINGS: Liver is fatty and enlarged measuring 20 cm in maximum sagittal dimension. Fatty infiltration limits evaluation for focal hepatic mass. There is no intrahepatic biliary ductal dilatation. Common bile duct measures 8.5 mm, likely due to prior cholecystectomy. Pancreas is incompletely visualized due to artifact from overlying bowel gas. Right kidney measures 12.5 cm and is normal.

Visualized abdominal aorta is without aneurysmal dilatation. IVC is normal. Portal vein measures 11 mm in diameter and demonstrates hepatopetal flow. Hepatic veins are also patent. There is no ascites.
IMPRESSION: 1. Fatty and enlarged liver. 

2. Prior cholecystectomy. 

3. Pancreas incompletely visualized due to artifact from overlying bowel gas.

## 2020-11-03 IMAGING — US ABD LIMITED
1 series · 14 of 25 positions shown · non-contrast
Comparison: CT abdomen dated 08/24/2020, ultrasound dated 03/10/2020.

﻿EXAM: ROMELL PROFESSIONAL READ ABD U/S LMTD
INDICATION: Fatty liver.

[Series 1: abd limited · 14 of 46 slices shown]
[im 1/46]
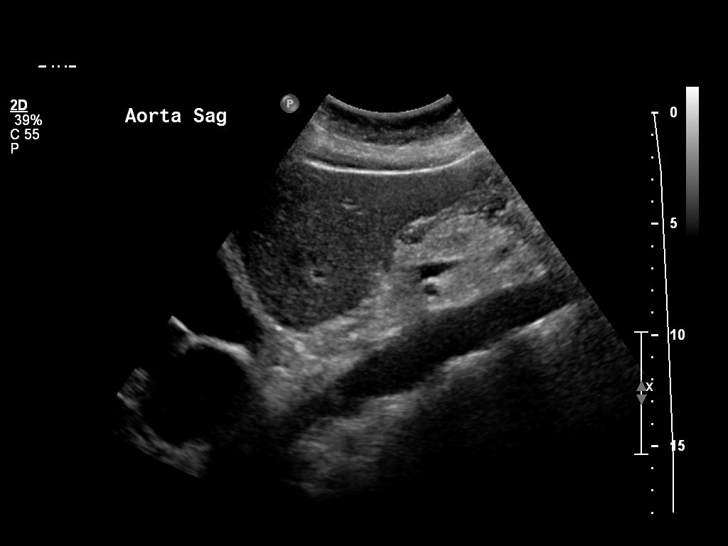
[im 4/46]
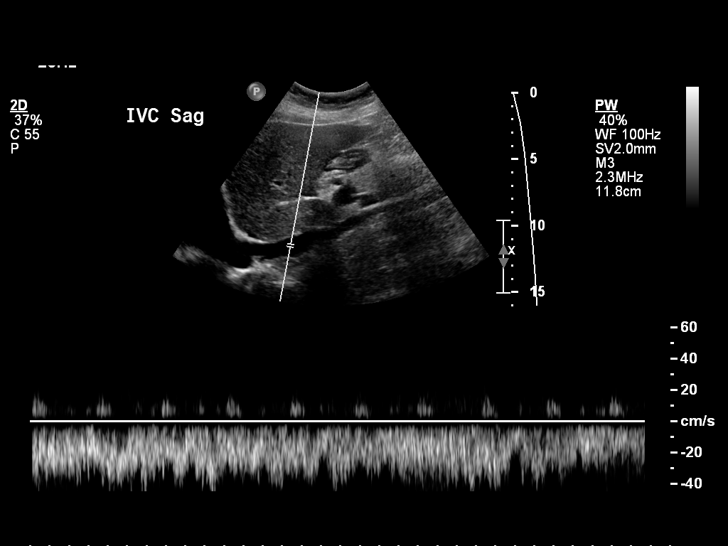
[im 8/46]
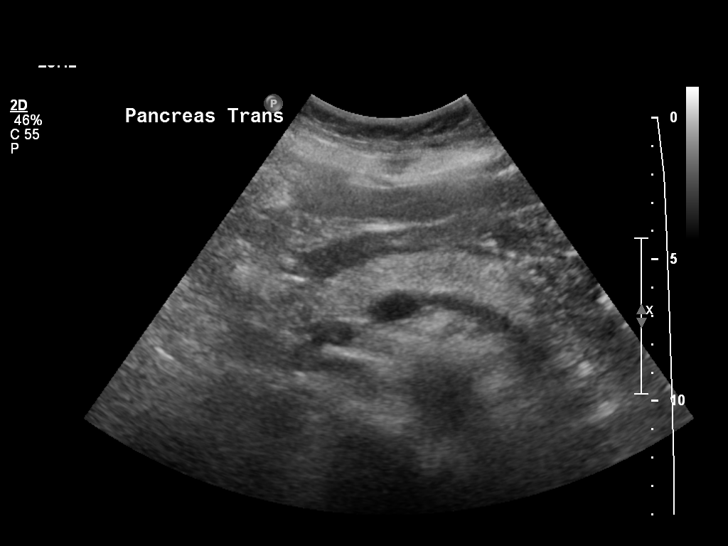
[im 12/46]
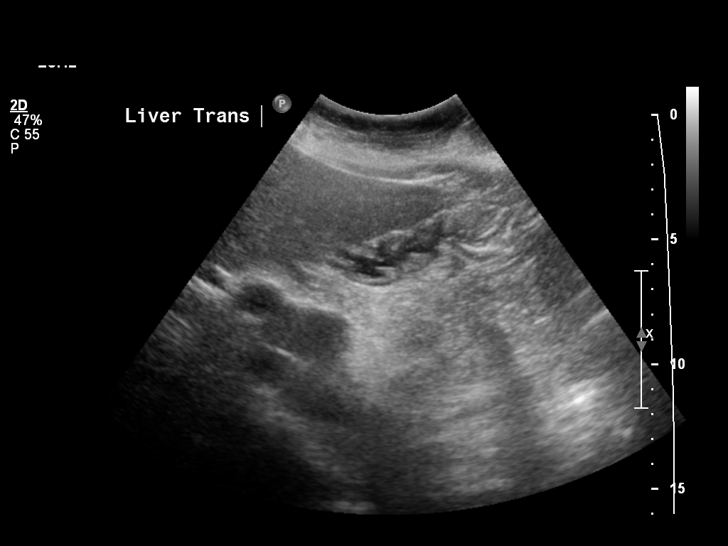
[im 16/46]
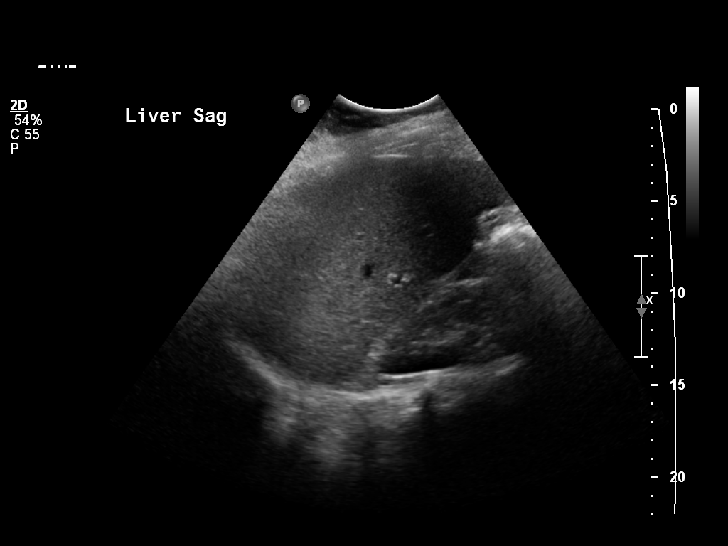
[im 17/46]
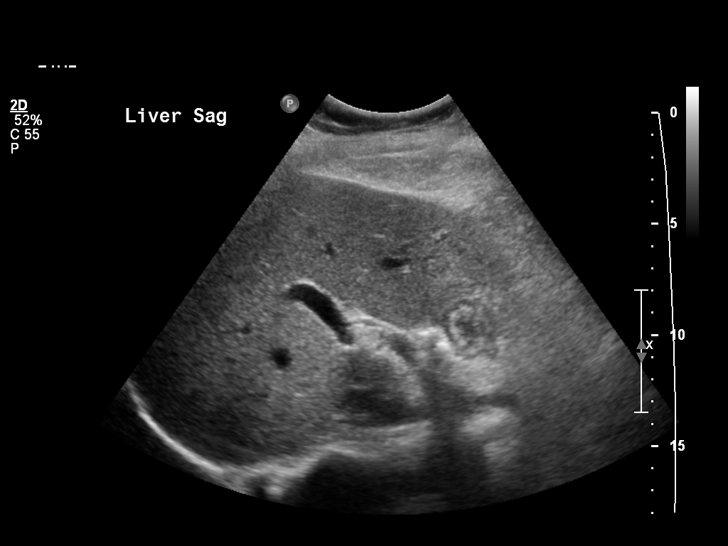
[im 21/46]
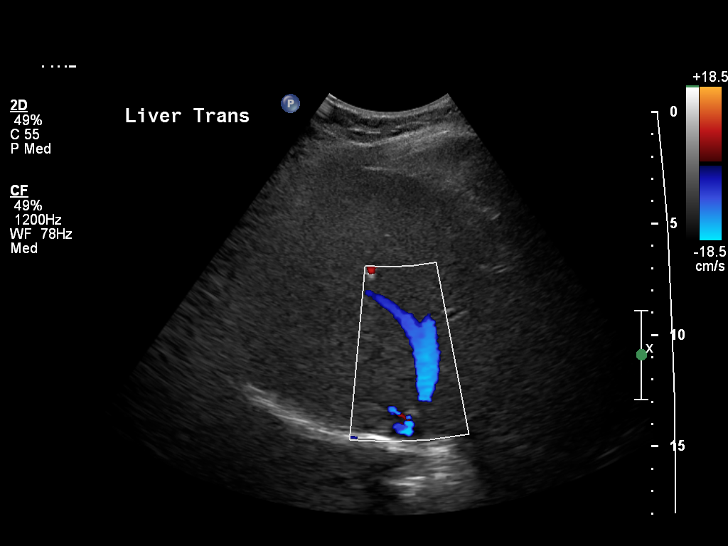
[im 25/46]
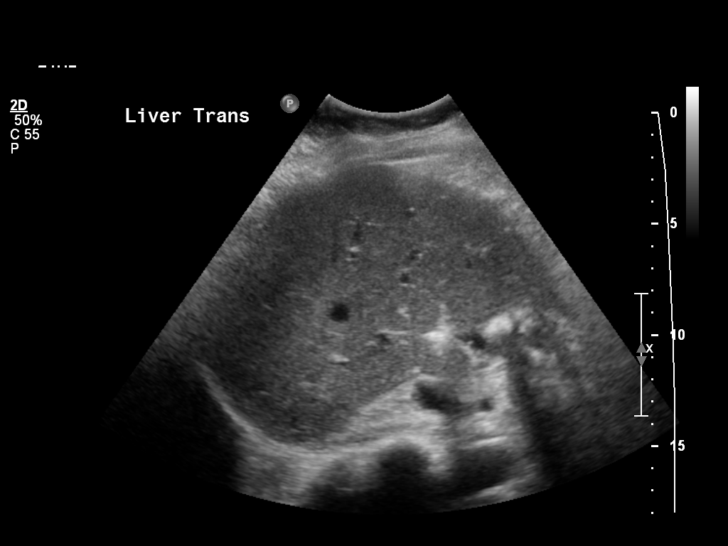
[im 29/46]
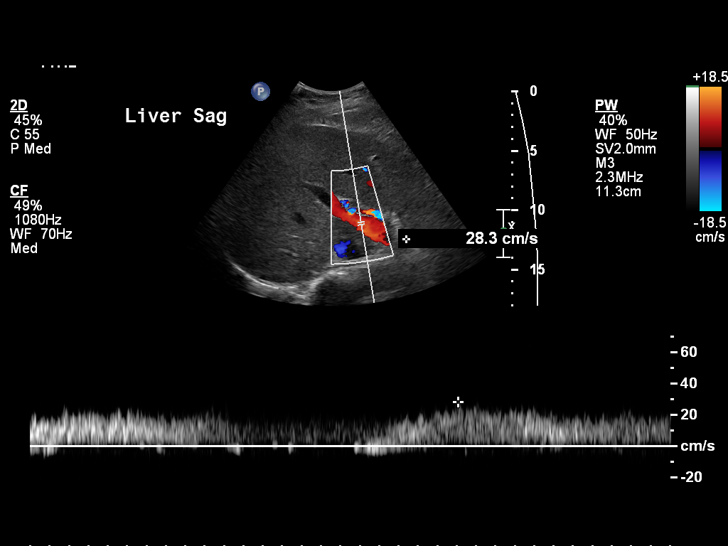
[im 31/46]
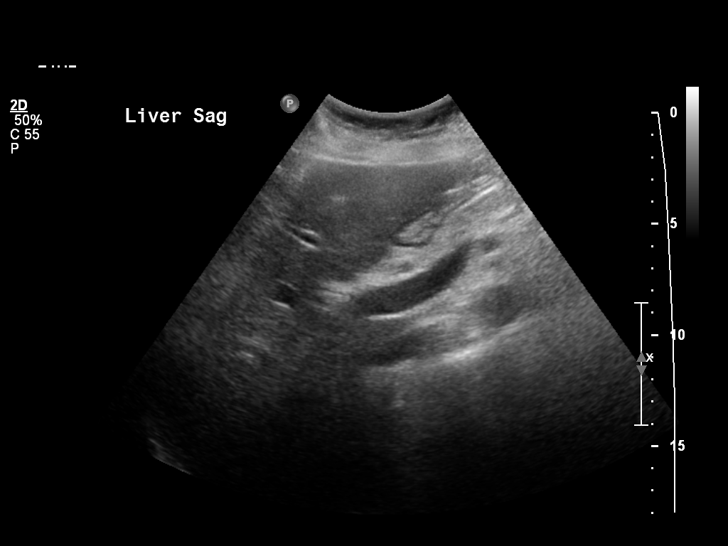
[im 34/46]
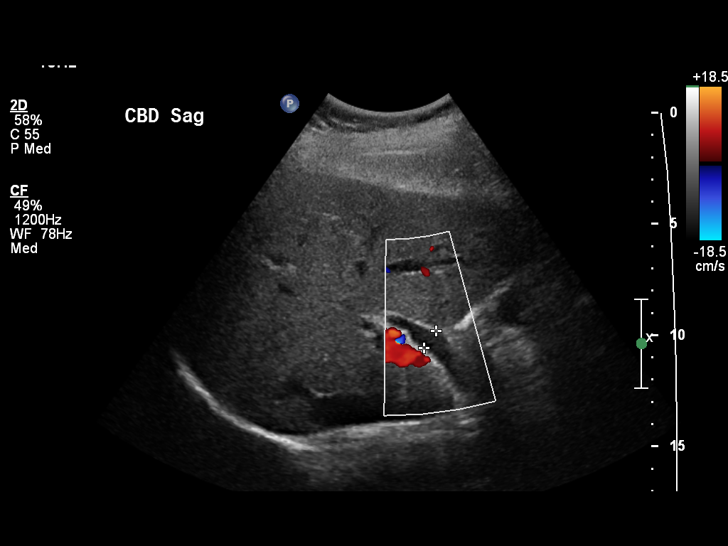
[im 38/46]
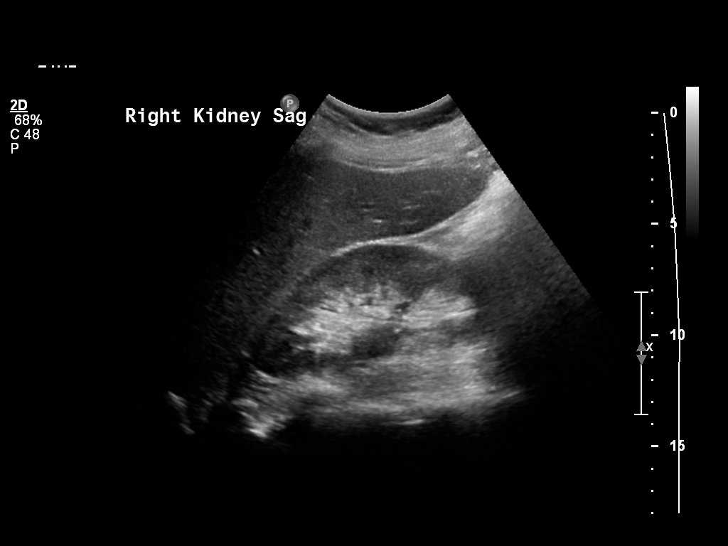
[im 42/46]
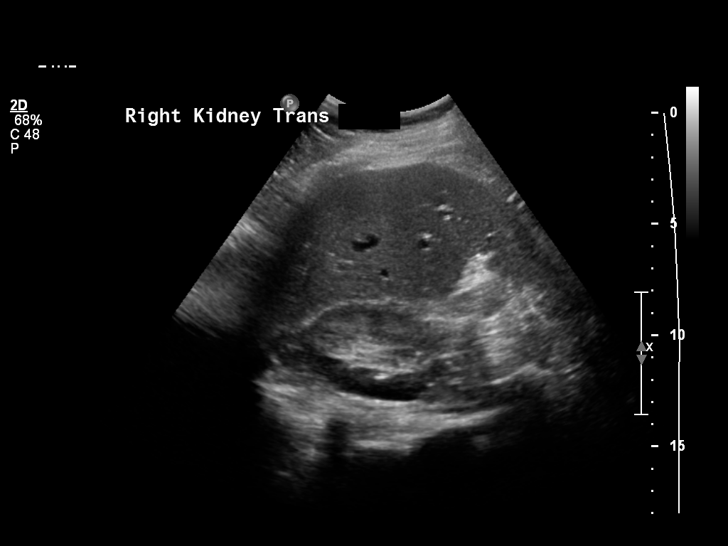
[im 46/46]
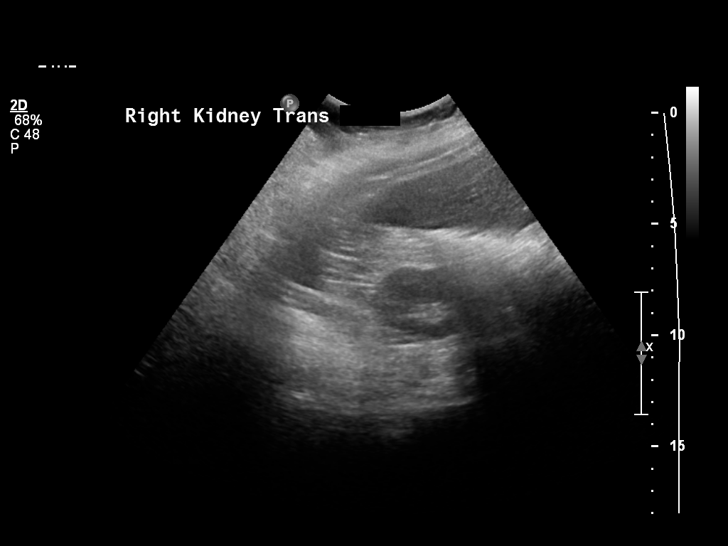

[14 of 25 positions shown; findings below may reference images not displayed]

FINDINGS: Gallbladder is absent.  Common hepatic duct measures 9 mm. 

Mild hepatomegaly 19 cm in length with echogenic texture due to fatty changes with mildly heterogeneous texture.  Portal vein and hepatic veins are patent.  Right kidney is normal.  Aorta, vena cava and partially visualized pancreas are normal.  No free fluid.
IMPRESSION: 1. Absent gallbladder.  Normal bile ducts.

2. Mild hepatomegaly with echogenic texture of the liver.  Portal vein and hepatic veins are patent. No free fluid.

## 2021-07-16 ENCOUNTER — Other Ambulatory Visit (HOSPITAL_COMMUNITY): Payer: Self-pay | Admitting: PHYSICIAN ASSISTANT

## 2021-07-16 DIAGNOSIS — Z1231 Encounter for screening mammogram for malignant neoplasm of breast: Secondary | ICD-10-CM

## 2021-07-16 DIAGNOSIS — R928 Other abnormal and inconclusive findings on diagnostic imaging of breast: Secondary | ICD-10-CM

## 2021-08-04 ENCOUNTER — Other Ambulatory Visit: Payer: Self-pay

## 2021-08-04 ENCOUNTER — Ambulatory Visit
Admission: RE | Admit: 2021-08-04 | Discharge: 2021-08-04 | Disposition: A | Discharge status: Home / Self Care | Payer: Medicaid Other | Source: Ambulatory Visit | Attending: PHYSICIAN ASSISTANT | Admitting: PHYSICIAN ASSISTANT

## 2021-08-04 DIAGNOSIS — R928 Other abnormal and inconclusive findings on diagnostic imaging of breast: Secondary | ICD-10-CM

## 2021-08-05 ENCOUNTER — Other Ambulatory Visit (HOSPITAL_PSYCHIATRIC): Payer: Self-pay | Admitting: NURSE PRACTITIONER-PSYCHIATRIC-MENTAL HEALTH

## 2021-08-05 MED ORDER — ZOLPIDEM 10 MG TABLET
10.0000 mg | ORAL_TABLET | Freq: Every evening | ORAL | 0 refills | Status: DC
Start: 2021-08-05 — End: 2021-09-23

## 2021-08-05 MED ORDER — DEXTROMETHORPHAN 20 MG-QUINIDINE 10 MG CAPSULE
1.0000 | ORAL_CAPSULE | Freq: Every day | ORAL | 2 refills | Status: DC
Start: 2021-08-05 — End: 2021-09-08

## 2021-08-05 MED ORDER — LURASIDONE 80 MG TABLET
80.0000 mg | ORAL_TABLET | Freq: Every day | ORAL | 2 refills | Status: DC
Start: 2021-08-05 — End: 2021-09-08

## 2021-08-05 MED ORDER — DULOXETINE 60 MG CAPSULE,DELAYED RELEASE
60.0000 mg | DELAYED_RELEASE_CAPSULE | Freq: Every day | ORAL | 2 refills | Status: DC
Start: 2021-08-05 — End: 2021-09-08

## 2021-08-09 ENCOUNTER — Other Ambulatory Visit (HOSPITAL_PSYCHIATRIC): Payer: Self-pay | Admitting: NURSE PRACTITIONER-PSYCHIATRIC-MENTAL HEALTH

## 2021-08-09 MED ORDER — ALPRAZOLAM 1 MG TABLET
1.0000 mg | ORAL_TABLET | Freq: Three times a day (TID) | ORAL | 0 refills | Status: DC | PRN
Start: 2021-08-09 — End: 2021-09-08

## 2021-08-09 MED ORDER — HYDROXYZINE PAMOATE 25 MG CAPSULE
25.0000 mg | ORAL_CAPSULE | Freq: Every evening | ORAL | 0 refills | Status: DC | PRN
Start: 2021-08-09 — End: 2021-09-23

## 2021-08-12 ENCOUNTER — Emergency Department (HOSPITAL_BASED_OUTPATIENT_CLINIC_OR_DEPARTMENT_OTHER): Payer: Medicaid Other

## 2021-08-12 ENCOUNTER — Encounter (HOSPITAL_BASED_OUTPATIENT_CLINIC_OR_DEPARTMENT_OTHER): Payer: Self-pay

## 2021-08-12 ENCOUNTER — Emergency Department
Admission: EM | Admit: 2021-08-12 | Discharge: 2021-08-12 | Disposition: A | Discharge status: Home / Self Care | Payer: Medicaid Other | Attending: FAMILY PRACTICE | Admitting: FAMILY PRACTICE

## 2021-08-12 ENCOUNTER — Other Ambulatory Visit: Payer: Self-pay

## 2021-08-12 ENCOUNTER — Emergency Department (EMERGENCY_DEPARTMENT_HOSPITAL): Payer: Medicaid Other

## 2021-08-12 DIAGNOSIS — M3500 Sicca syndrome, unspecified: Secondary | ICD-10-CM | POA: Insufficient documentation

## 2021-08-12 DIAGNOSIS — Z6833 Body mass index (BMI) 33.0-33.9, adult: Secondary | ICD-10-CM

## 2021-08-12 DIAGNOSIS — I10 Essential (primary) hypertension: Secondary | ICD-10-CM | POA: Insufficient documentation

## 2021-08-12 DIAGNOSIS — E785 Hyperlipidemia, unspecified: Secondary | ICD-10-CM | POA: Insufficient documentation

## 2021-08-12 DIAGNOSIS — M25552 Pain in left hip: Secondary | ICD-10-CM

## 2021-08-12 DIAGNOSIS — E119 Type 2 diabetes mellitus without complications: Secondary | ICD-10-CM | POA: Insufficient documentation

## 2021-08-12 DIAGNOSIS — M7630 Iliotibial band syndrome, unspecified leg: Secondary | ICD-10-CM | POA: Insufficient documentation

## 2021-08-12 DIAGNOSIS — M069 Rheumatoid arthritis, unspecified: Secondary | ICD-10-CM | POA: Insufficient documentation

## 2021-08-12 NOTE — Discharge Instructions (Signed)
You appear to have iliotibial band syndrome, you need stretches and exercises, some of which are listed in this discharge.  You should likely have a physical therapy referral for physical therapy since this is may ongoing for 3-4 months if not longer.  Continuing home medications that she may take, I have given you a 4 day prescription for ibuprofen, which should be used cautiously due to your rheumatoid arthritis, Sjogren syndrome, and medications after taking for those to chronic illnesses.

## 2021-08-12 NOTE — ED Provider Notes (Signed)
[facility] Emergency Department  ED Primary Provider Note  History of Present Illness   Chief Complaint   Patient presents with   . Leg Pain     Karen Gilbert is a 41 y.o. female who had concerns including Leg Pain.  Arrival: The patient arrived by Car    This 41 year old female patient presents worse or with 4 month history of left knee and hip pain, saw orthopedics about 3-4 months ago, was referred to physical therapy, saw her family medical provider recently with continued pain, called her neurologist who wanted her to come here to be evaluated for a blood clot in the leg.  She denies any known injuries.  She said that a history of diabetes mellitus type 2, dyslipidemia, hypertension, Sjogren's and rheumatoid arthritis.  She denies alcohol, illicit drugs, marijuana, or tobacco abuse.    Patient is not COVID vaccinated.        Review of Systems   Pertinent positive and negative ROS as per HPI.  Historical Data   History Reviewed This Encounter:  Past medical, surgical, social history reviewed and noted with the patient.    Physical Exam   ED Triage Vitals [08/12/21 1014]   BP (Non-Invasive) (!) 154/108   Heart Rate 94   Respiratory Rate 16   Temperature 36.3 C (97.4 F)   SpO2 100 %   Weight 109 kg (240 lb)   Height 1.803 m ('5\' 11"'$ )     Physical Exam   General: No acute distress at rest, nontoxic.    Pelvis:  Stable   Left leg: Tenderness at the trochanter, and along the ITB to the lateral aspect of the knee.  Straight leg raise to approximately 80, some tightness and pain in the lateral leg.  Flex need to greater than 90.  Left knee:  No patellar pain, no medial knee compartment pain, tenderness to palpation along the lateral knee to the fibular head.  She also has palpatory tenderness along the entire thigh to the knee.  Valgus and varus stresses show lateral pain, Lachman's is negative.  Neurovascular:  Good light touch sensation to the anterior, posterior,  medial and lateral aspects of the leg to the foot.  Cap refill brisk, posterior tibial pulse 2/4.    Patient Data   Labs Ordered/Reviewed - No data to display  XR HIP LEFT W PELVIS 2-3 VIEWS   ED Interpretation by Bretta Bang, DO (03/16 1232)   Wet read of the pelvis and left hip:  No acute process.        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            Clinical Impression   Iliotibial band syndrome, unspecified laterality (Primary)       Disposition: Discharged    .Marland KitchenBretta Bang, DO  08/12/2021, 12:36

## 2021-08-12 NOTE — ED Triage Notes (Signed)
Lt upper lateral leg and hip and knee pain, describes as sharp, achy and constant. no injury, for 1 week

## 2021-08-17 ENCOUNTER — Ambulatory Visit (HOSPITAL_COMMUNITY): Payer: Self-pay

## 2021-08-19 ENCOUNTER — Ambulatory Visit (HOSPITAL_COMMUNITY): Payer: Self-pay

## 2021-08-21 ENCOUNTER — Other Ambulatory Visit: Payer: Self-pay

## 2021-08-26 ENCOUNTER — Emergency Department (EMERGENCY_DEPARTMENT_HOSPITAL): Payer: Medicaid Other

## 2021-08-26 ENCOUNTER — Emergency Department
Admission: EM | Admit: 2021-08-26 | Discharge: 2021-08-27 | Disposition: A | Discharge status: Home / Self Care | Payer: Medicaid Other | Attending: Emergency Medicine | Admitting: Emergency Medicine

## 2021-08-26 ENCOUNTER — Other Ambulatory Visit: Payer: Self-pay

## 2021-08-26 ENCOUNTER — Encounter (HOSPITAL_BASED_OUTPATIENT_CLINIC_OR_DEPARTMENT_OTHER): Payer: Self-pay

## 2021-08-26 DIAGNOSIS — E860 Dehydration: Secondary | ICD-10-CM | POA: Insufficient documentation

## 2021-08-26 DIAGNOSIS — K5730 Diverticulosis of large intestine without perforation or abscess without bleeding: Secondary | ICD-10-CM | POA: Insufficient documentation

## 2021-08-26 DIAGNOSIS — Z6833 Body mass index (BMI) 33.0-33.9, adult: Secondary | ICD-10-CM

## 2021-08-26 DIAGNOSIS — A084 Viral intestinal infection, unspecified: Secondary | ICD-10-CM | POA: Insufficient documentation

## 2021-08-26 DIAGNOSIS — R197 Diarrhea, unspecified: Secondary | ICD-10-CM

## 2021-08-26 LAB — CBC WITH DIFF
BASOPHIL #: 0.01 10*3/uL (ref 0.00–2.50)
BASOPHIL %: 0 % (ref 0–3)
EOSINOPHIL #: 0.07 10*3/uL (ref 0.00–2.40)
EOSINOPHIL %: 1 % (ref 0–7)
HCT: 37.4 % (ref 37.0–47.0)
HGB: 12.8 g/dL (ref 12.5–16.0)
LYMPHOCYTE #: 1.46 10*3/uL — ABNORMAL LOW (ref 2.10–11.00)
LYMPHOCYTE %: 19 % — ABNORMAL LOW (ref 25–45)
MCH: 27.1 pg (ref 27.0–32.0)
MCHC: 34.3 g/dL (ref 32.0–36.0)
MCV: 79.1 fL (ref 78.0–99.0)
MONOCYTE #: 0.4 10*3/uL (ref 0.00–4.10)
MONOCYTE %: 5 % (ref 0–12)
MPV: 7 fL — ABNORMAL LOW (ref 7.4–10.4)
NEUTROPHIL #: 5.93 10*3/uL (ref 4.10–29.00)
NEUTROPHIL %: 75 % (ref 40–76)
PLATELETS: 328 10*3/uL (ref 140–440)
RBC: 4.73 10*6/uL (ref 4.20–5.40)
RDW: 17.6 % — ABNORMAL HIGH (ref 11.6–14.8)
WBC: 7.9 10*3/uL (ref 4.0–10.5)

## 2021-08-26 LAB — URINALYSIS, MACRO/MICRO
BLOOD: NEGATIVE mg/dL
GLUCOSE: NEGATIVE mg/dL
LEUKOCYTES: NEGATIVE WBCs/uL
NITRITE: NEGATIVE
PH: 6 (ref 4.6–8.0)
PROTEIN: NEGATIVE mg/dL
SPECIFIC GRAVITY: >=1.03 (ref 1.003–1.035)
UROBILINOGEN: 1 mg/dL (ref 0.2–1.0)

## 2021-08-26 LAB — COMPREHENSIVE METABOLIC PANEL, NON-FASTING
ALBUMIN/GLOBULIN RATIO: 0.9 (ref 0.8–1.4)
ALBUMIN: 3.5 g/dL (ref 3.4–5.0)
ALKALINE PHOSPHATASE: 73 U/L (ref 46–116)
ALT (SGPT): 17 U/L (ref <=78)
ANION GAP: 12 mmol/L (ref 10–20)
AST (SGOT): 11 U/L — ABNORMAL LOW (ref 15–37)
BILIRUBIN TOTAL: 0.5 mg/dL (ref 0.2–1.0)
BUN/CREA RATIO: 14
BUN: 14 mg/dL (ref 7–18)
CALCIUM, CORRECTED: 9 mg/dL
CALCIUM: 8.5 mg/dL (ref 8.5–10.1)
CHLORIDE: 100 mmol/L (ref 98–107)
CO2 TOTAL: 27 mmol/L (ref 21–32)
CREATININE: 1.02 mg/dL (ref 0.55–1.02)
ESTIMATED GFR: 70 mL/min/{1.73_m2} (ref >59)
GLOBULIN: 3.9
GLUCOSE: 201 mg/dL — ABNORMAL HIGH (ref 74–106)
OSMOLALITY, CALCULATED: 284 mOsm/kg (ref 270–290)
POTASSIUM: 3.2 mmol/L — ABNORMAL LOW (ref 3.5–5.1)
PROTEIN TOTAL: 7.4 g/dL (ref 6.4–8.2)
SODIUM: 139 mmol/L (ref 136–145)

## 2021-08-26 LAB — LIPASE: LIPASE: 61 U/L — ABNORMAL LOW (ref 73–393)

## 2021-08-26 LAB — ACETONE: ACETONE: NEGATIVE

## 2021-08-26 MED ORDER — DICYCLOMINE 10 MG CAPSULE
20.0000 mg | ORAL_CAPSULE | ORAL | Status: AC
Start: 2021-08-26 — End: 2021-08-26
  Administered 2021-08-26: 20 mg via ORAL
  Filled 2021-08-26: qty 2

## 2021-08-26 MED ORDER — DIPHENOXYLATE-ATROPINE 2.5 MG-0.025 MG TABLET
ORAL_TABLET | ORAL | Status: AC
Start: 2021-08-26 — End: 2021-08-26
  Filled 2021-08-26: qty 2

## 2021-08-26 MED ORDER — SODIUM CHLORIDE 0.9 % IV BOLUS
1000.0000 mL | INJECTION | Status: AC
Start: 2021-08-26 — End: 2021-08-27
  Administered 2021-08-26: 1000 mL via INTRAVENOUS
  Administered 2021-08-27: 0 mL via INTRAVENOUS

## 2021-08-26 MED ORDER — DICYCLOMINE 20 MG TABLET
ORAL_TABLET | ORAL | Status: AC
Start: 2021-08-26 — End: 2021-08-26
  Filled 2021-08-26: qty 1

## 2021-08-26 MED ORDER — DIPHENOXYLATE-ATROPINE 2.5 MG-0.025 MG TABLET
2.0000 | ORAL_TABLET | ORAL | Status: AC
Start: 2021-08-26 — End: 2021-08-26
  Administered 2021-08-26: 2 via ORAL

## 2021-08-26 NOTE — ED Attending Note (Signed)
[place] emergency department         HISTORY OF PRESENT ILLNESS     Date:  08/26/2021  Patient's Name:  Karen Gilbert  Date of Birth:  25-Mar-1981    Patient is a 41 year old diabetic presenting with diarrhea beginning yesterday nausea and vomiting yesterday today only diarrhea.  Patient states she has been unable to keep anything down due to nausea and bloating.          Review of Systems     Review of Systems   Constitutional: Negative.    HENT: Negative.    Respiratory: Negative.    Cardiovascular: Negative.    Gastrointestinal: Positive for diarrhea.   Endocrine: Negative.    Musculoskeletal: Negative.    Skin: Negative.    Neurological: Negative.    Psychiatric/Behavioral: Negative.    All other systems reviewed and are negative.      Previous History     Past Medical History:  Past Medical History:   Diagnosis Date   . Diabetes mellitus type II    . HTN (hypertension)    . Migraine        Past Surgical History:  Past Surgical History:   Procedure Laterality Date   . Hx cholecystectomy     . Hx shoulder arthroscopy     . Hx tonsillectomy     . Hx tubal ligation         Social History:  Social History     Tobacco Use   . Smoking status: Never   . Smokeless tobacco: Never   Substance Use Topics   . Alcohol use: No     Social History     Substance and Sexual Activity   Drug Use Not on file       Family History:  No family history on file.    Medication History:  Current Outpatient Medications   Medication Sig   . albuterol sulfate (PROAIR HFA) 90 mcg/actuation Inhalation oral inhaler Take 90 Puffs by inhalation Twice daily   . ALPRAZolam (XANAX) 1 mg Oral Tablet Take 1 Tablet (1 mg total) by mouth Three times a day as needed for Insomnia   . amLODIPine (NORVASC) 2.5 mg Oral Tablet Take 1 Tablet (2.5 mg total) by mouth Once a day   . benzocaine (ORAL ANALGESIC) 20 % Mucous Membrane Gel Take 40 mg by mouth Once a day   . bethanechol chloride (URECHOLINE) 25 mg Oral  Tablet Take 25 mg combined by mouth Twice daily   . dextromethorphan-quinidine (NUEDEXTA) 20-10 mg Oral Capsule Take 1 Capsule by mouth Once a day   . Diclofenac Sodium (VOLTAREN XR) 100 mg Oral Tablet Sustained Release 24 hr Take 1 Tablet (100 mg total) by mouth Once a day   . dilTIAZem (CARDIZEM CD) 300 mg Oral Capsule, Sust. Release 24 hr Take 1 Capsule (300 mg total) by mouth Once a day   . DULoxetine (CYMBALTA DR) 60 mg Oral Capsule, Delayed Release(E.C.) Take 1 Capsule (60 mg total) by mouth Once a day   . ferrous sulfate (FERATAB) 324 mg (65 mg iron) Oral Tablet, Delayed Release (E.C.) Take 1 Tablet (324 mg total) by mouth Every morning before breakfast   . fluticasone (FLOVENT) 110 mcg/actuation Inhalation Aerosol oral inhaler Take 1 Puff by inhalation Twice daily   . furosemide (LASIX) 20 mg Oral Tablet Take 1 Tablet (20 mg total) by mouth Once a day   . gabapentin (NEURONTIN) 300 mg Oral  Capsule Take 1 Capsule (300 mg total) by mouth Three times a day Two caps three times daily   . hydrochlorothiazide (HYDRODIURIL) 25 mg Oral Tablet Take 1 Tablet (25 mg total) by mouth Once a day   . HYDROcodone-acetaminophen (NORCO) 10-325 mg Oral Tablet Take 1 Tab by mouth Every 6 hours as needed for Pain   . hydrOXYzine pamoate (VISTARIL) 25 mg Oral Capsule Take 1 Capsule (25 mg total) by mouth Every night as needed 25-50 mg QHS as needed.   . Ibuprofen (MOTRIN) 800 mg Oral Tablet Take 1 Tablet (800 mg total) by mouth Every 8 hours   . INSULIN DETEMIR (LEVEMIR FLEXPEN SUBQ) 60 Units by Subcutaneous route   . lurasidone (LATUDA) 80 mg Oral Tablet Take 1 Tablet (80 mg total) by mouth Once a day Must administer with food (at least 350 calories)   . Olmesartan-Hydrochlorothiazide (BENICAR HCT) 40-12.5 mg Oral Tablet Take 1 Tab by mouth Once a day   . ondansetron (ZOFRAN ODT) 4 mg Oral Tablet, Rapid Dissolve Take 1 Tablet (4 mg total) by mouth Every 8 hours as needed for Nausea/Vomiting   . Sumatriptan Succinate (IMITREX)  100 mg Oral Tablet Take 100 mg by mouth Once, as needed for Migraine May repeat in 2 hours in needed   . thyroid (ARMOUR THYROID) 120 mg Oral Tablet Take 1.5 Tablets (180 mg total) by mouth Once a day   . topiramate (TOPAMAX) 50 mg Oral Tablet Take 1.5 Tablets (75 mg total) by mouth Once a day   . zolpidem (AMBIEN) 10 mg Oral Tablet Take 1 Tablet (10 mg total) by mouth Every night       Allergies:  Allergies   Allergen Reactions   . Codeine Itching   . Naproxen Swelling   . Motrin [Ibuprofen] Swelling     Swelling in legs       Physical Exam     Vitals:    BP 139/84   Pulse (!) 117   Temp 37 C (98.6 F)   Resp (!) 23   Ht 1.803 m ('5\' 11"'$ )   Wt 109 kg (240 lb)   SpO2 97%   BMI 33.47 kg/m           Physical Exam  Vitals and nursing note reviewed.   Constitutional:       General: She is not in acute distress.     Appearance: She is well-developed.   HENT:      Head: Normocephalic and atraumatic.      Mouth/Throat:      Mouth: Mucous membranes are dry.   Eyes:      Conjunctiva/sclera: Conjunctivae normal.   Cardiovascular:      Rate and Rhythm: Normal rate and regular rhythm.      Heart sounds: No murmur heard.  Pulmonary:      Effort: Pulmonary effort is normal. No respiratory distress.      Breath sounds: Normal breath sounds.   Abdominal:      General: Bowel sounds are normal.      Palpations: Abdomen is soft.      Tenderness: There is no abdominal tenderness.   Musculoskeletal:         General: No swelling.      Cervical back: Neck supple.   Skin:     General: Skin is warm and dry.      Capillary Refill: Capillary refill takes less than 2 seconds.   Neurological:      Mental Status: She  is alert.   Psychiatric:         Mood and Affect: Mood normal.         Diagnostic Studies/Treatment     Medications:  Medications Administered in the ED   NS bolus infusion 1,000 mL (0 mL Intravenous Stopped 08/27/21 0004)   dicyclomine (BENTYL) capsule (20 mg Oral Given 08/26/21 2300)   diphenoxylate-atropine (LOMOTIL)  2.5-0.025 mg per tablet (2 Tablets Oral Given 08/26/21 2334)       New Prescriptions    ONDANSETRON (ZOFRAN ODT) 4 MG ORAL TABLET, RAPID DISSOLVE    Take 1 Tablet (4 mg total) by mouth Every 8 hours as needed for Nausea/Vomiting       Labs:    Results for orders placed or performed during the hospital encounter of 08/26/21 (from the past 12 hour(s))   COMPREHENSIVE METABOLIC PANEL, NON-FASTING   Result Value Ref Range    SODIUM 139 136 - 145 mmol/L    POTASSIUM 3.2 (L) 3.5 - 5.1 mmol/L    CHLORIDE 100 98 - 107 mmol/L    CO2 TOTAL 27 21 - 32 mmol/L    ANION GAP 12 10 - 20 mmol/L    BUN 14 7 - 18 mg/dL    CREATININE 1.02 0.55 - 1.02 mg/dL    BUN/CREA RATIO 14     ESTIMATED GFR 70 >59 mL/min/1.15m2    ALBUMIN 3.5 3.4 - 5.0 g/dL    CALCIUM 8.5 8.5 - 10.1 mg/dL    GLUCOSE 201 (H) 74 - 106 mg/dL    ALKALINE PHOSPHATASE 73 46 - 116 U/L    ALT (SGPT) 17 <=78 U/L    AST (SGOT) 11 (L) 15 - 37 U/L    BILIRUBIN TOTAL 0.5 0.2 - 1.0 mg/dL    PROTEIN TOTAL 7.4 6.4 - 8.2 g/dL    ALBUMIN/GLOBULIN RATIO 0.9 0.8 - 1.4    OSMOLALITY, CALCULATED 284 270 - 290 mOsm/kg    CALCIUM, CORRECTED 9.0 mg/dL    GLOBULIN 3.9    LIPASE   Result Value Ref Range    LIPASE 61 (L) 73 - 393 U/L   CBC WITH DIFF   Result Value Ref Range    WBCS UNCORRECTED      WBC 7.9 4.0 - 10.5 x10^3/uL    RBC 4.73 4.20 - 5.40 x10^6/uL    HGB 12.8 12.5 - 16.0 g/dL    HCT 37.4 37.0 - 47.0 %    MCV 79.1 78.0 - 99.0 fL    MCH 27.1 27.0 - 32.0 pg    MCHC 34.3 32.0 - 36.0 g/dL    RDW 17.6 (H) 11.6 - 14.8 %    PLATELETS 328 140 - 440 x10^3/uL    MPV 7.0 (L) 7.4 - 10.4 fL    NEUTROPHIL % 75 40 - 76 %    LYMPHOCYTE % 19 (L) 25 - 45 %    MONOCYTE % 5 0 - 12 %    EOSINOPHIL % 1 0 - 7 %    BASOPHIL % 0 0 - 3 %    NEUTROPHIL # 5.93 4.10 - 29.00 x10^3/uL    LYMPHOCYTE # 1.46 (L) 2.10 - 11.00 x10^3/uL    MONOCYTE # 0.40 0.00 - 4.10 x10^3/uL    EOSINOPHIL # 0.07 0.00 - 2.40 x10^3/uL    BASOPHIL # 0.01 0.00 - 2.50 x10^3/uL   ACETONE [BLF ONLY]   Result Value Ref Range    ACETONE SERUM  Negative Negative   URINALYSIS, MACRO/MICRO  Result Value Ref Range    COLOR Yellow Yellow, Colorless, Light Yellow, Dark Yellow    APPEARANCE Clear Clear    SPECIFIC GRAVITY >=1.030 1.003 - 1.035    PH 6.0 4.6 - 8.0    LEUKOCYTES Negative Negative WBCs/uL    NITRITE Negative Negative    PROTEIN Negative Negative mg/dL    GLUCOSE Negative Negative mg/dL    KETONES Trace (A) Negative mg/dL    BILIRUBIN Small (A) Negative mg/dL    BLOOD Negative Negative mg/dL    UROBILINOGEN 1.0 0.2 - 1.0 mg/dL        Radiology:  CT ABDOMEN PELVIS WO IV CONTRAST    CT ABDOMEN PELVIS WO IV CONTRAST    (Results Pending)     CT negative for any acute process  ECG:  NONE            Differential diagnosis  Gastroenteritis, hyperglycemia, dehydration    Course/Disposition/Plan     Course:     patient started on IV fluids and anti emetics.  Patient's symptoms much improved will be discharged home    Disposition:    Discharged    Condition at Disposition:    Stable    Follow up:   Zadie Rhine, FNP  [address]  [place] [address]  [phone]    In 3 days  If symptoms worsen      Clinical Impression:     Clinical Impression   Dehydration (Primary)   Diarrhea, unspecified type   Viral gastroenteritis         Winfred Burn, MD

## 2021-08-26 NOTE — ED Triage Notes (Signed)
Pt has NVD since yesterday. Diarrhea was the first symptom. Pt reports a fever of 100.3 4 hours ago. Pt states that she took 1600 mg of motrin and temp is now down to 98.6. Pt also states that she had an elevated heart rate of 133 and took 600 mg of cardizem. Heart rate is now 117.

## 2021-08-27 ENCOUNTER — Ambulatory Visit (HOSPITAL_COMMUNITY): Payer: Self-pay

## 2021-08-27 DIAGNOSIS — K5730 Diverticulosis of large intestine without perforation or abscess without bleeding: Secondary | ICD-10-CM

## 2021-08-27 MED ORDER — ONDANSETRON 4 MG DISINTEGRATING TABLET
4.0000 mg | ORAL_TABLET | Freq: Three times a day (TID) | ORAL | 0 refills | Status: DC | PRN
Start: 2021-08-27 — End: 2021-09-08

## 2021-08-27 NOTE — Discharge Instructions (Signed)
Clear liquids for the next 8-12 hours

## 2021-08-27 NOTE — ED Nurses Note (Signed)
Discharge instructions given to pt, verbalized understanding and denies any questions or concerns. VSS. Rx sent to pharmacy for Zofran. Pt to follow up with PCP. Pt is ambulatory and discharged home with self.

## 2021-09-06 ENCOUNTER — Encounter (HOSPITAL_PSYCHIATRIC): Payer: Self-pay | Admitting: NURSE PRACTITIONER-PSYCHIATRIC-MENTAL HEALTH

## 2021-09-06 DIAGNOSIS — F0630 Mood disorder due to known physiological condition, unspecified: Secondary | ICD-10-CM

## 2021-09-06 DIAGNOSIS — F331 Major depressive disorder, recurrent, moderate: Secondary | ICD-10-CM

## 2021-09-06 DIAGNOSIS — F411 Generalized anxiety disorder: Secondary | ICD-10-CM

## 2021-09-06 DIAGNOSIS — F3163 Bipolar disorder, current episode mixed, severe, without psychotic features: Secondary | ICD-10-CM

## 2021-09-06 DIAGNOSIS — F609 Personality disorder, unspecified: Secondary | ICD-10-CM

## 2021-09-07 DIAGNOSIS — F609 Personality disorder, unspecified: Secondary | ICD-10-CM | POA: Insufficient documentation

## 2021-09-07 DIAGNOSIS — F0630 Mood disorder due to known physiological condition, unspecified: Secondary | ICD-10-CM | POA: Insufficient documentation

## 2021-09-07 DIAGNOSIS — F411 Generalized anxiety disorder: Secondary | ICD-10-CM | POA: Insufficient documentation

## 2021-09-07 DIAGNOSIS — F3163 Bipolar disorder, current episode mixed, severe, without psychotic features: Secondary | ICD-10-CM | POA: Insufficient documentation
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
## 2021-09-08 ENCOUNTER — Other Ambulatory Visit (HOSPITAL_PSYCHIATRIC): Payer: Self-pay | Admitting: NURSE PRACTITIONER-PSYCHIATRIC-MENTAL HEALTH

## 2021-09-08 ENCOUNTER — Telehealth (HOSPITAL_PSYCHIATRIC): Payer: Self-pay | Admitting: NURSE PRACTITIONER-PSYCHIATRIC-MENTAL HEALTH

## 2021-09-08 MED ORDER — DULOXETINE 60 MG CAPSULE,DELAYED RELEASE
60.0000 mg | DELAYED_RELEASE_CAPSULE | Freq: Every day | ORAL | 2 refills | Status: DC
Start: 2021-09-08 — End: 2021-09-23

## 2021-09-08 MED ORDER — LURASIDONE 80 MG TABLET
80.0000 mg | ORAL_TABLET | Freq: Every day | ORAL | 2 refills | Status: DC
Start: 2021-09-08 — End: 2021-09-23

## 2021-09-08 NOTE — Telephone Encounter (Signed)
Patient called and is needing refills on all of her medications.     [place], [place]

## 2021-09-09 ENCOUNTER — Other Ambulatory Visit (HOSPITAL_PSYCHIATRIC): Payer: Self-pay | Admitting: NURSE PRACTITIONER-PSYCHIATRIC-MENTAL HEALTH

## 2021-09-09 MED ORDER — ALPRAZOLAM 1 MG TABLET
1.0000 mg | ORAL_TABLET | Freq: Three times a day (TID) | ORAL | 0 refills | Status: DC | PRN
Start: 2021-09-09 — End: 2021-09-23

## 2021-09-09 MED ORDER — DEXTROMETHORPHAN 20 MG-QUINIDINE 10 MG CAPSULE
1.0000 | ORAL_CAPSULE | Freq: Every day | ORAL | 2 refills | Status: DC
Start: 2021-09-09 — End: 2021-09-23

## 2021-09-09 MED ORDER — ONDANSETRON 4 MG DISINTEGRATING TABLET
4.0000 mg | ORAL_TABLET | Freq: Three times a day (TID) | ORAL | 0 refills | Status: AC | PRN
Start: 2021-09-09 — End: ?

## 2021-09-10 ENCOUNTER — Ambulatory Visit (HOSPITAL_COMMUNITY): Payer: Self-pay

## 2021-09-14 ENCOUNTER — Ambulatory Visit (HOSPITAL_PSYCHIATRIC): Payer: Medicaid Other | Admitting: NURSE PRACTITIONER-PSYCHIATRIC-MENTAL HEALTH

## 2021-09-23 ENCOUNTER — Encounter (HOSPITAL_PSYCHIATRIC): Payer: Self-pay | Admitting: NURSE PRACTITIONER-PSYCHIATRIC-MENTAL HEALTH

## 2021-09-23 ENCOUNTER — Other Ambulatory Visit: Payer: Self-pay

## 2021-09-23 ENCOUNTER — Ambulatory Visit
Payer: Medicaid Other | Attending: NURSE PRACTITIONER-PSYCHIATRIC-MENTAL HEALTH | Admitting: NURSE PRACTITIONER-PSYCHIATRIC-MENTAL HEALTH

## 2021-09-23 VITALS — BP 142/86 | HR 84 | Resp 18 | Ht 71.0 in | Wt 240.0 lb

## 2021-09-23 DIAGNOSIS — F609 Personality disorder, unspecified: Secondary | ICD-10-CM | POA: Insufficient documentation

## 2021-09-23 DIAGNOSIS — F411 Generalized anxiety disorder: Secondary | ICD-10-CM | POA: Insufficient documentation

## 2021-09-23 DIAGNOSIS — Z6833 Body mass index (BMI) 33.0-33.9, adult: Secondary | ICD-10-CM

## 2021-09-23 DIAGNOSIS — F3163 Bipolar disorder, current episode mixed, severe, without psychotic features: Secondary | ICD-10-CM | POA: Insufficient documentation

## 2021-09-23 DIAGNOSIS — G4700 Insomnia, unspecified: Secondary | ICD-10-CM

## 2021-09-23 MED ORDER — DEXTROMETHORPHAN 20 MG-QUINIDINE 10 MG CAPSULE
1.0000 | ORAL_CAPSULE | Freq: Every day | ORAL | 0 refills | Status: DC
Start: 2021-09-23 — End: 2021-12-23

## 2021-09-23 MED ORDER — ALPRAZOLAM 1 MG TABLET
1.0000 mg | ORAL_TABLET | Freq: Three times a day (TID) | ORAL | 1 refills | Status: DC | PRN
Start: 2021-09-23 — End: 2021-12-06

## 2021-09-23 MED ORDER — LURASIDONE 80 MG TABLET
80.0000 mg | ORAL_TABLET | Freq: Every day | ORAL | 0 refills | Status: DC
Start: 2021-09-23 — End: 2021-12-23

## 2021-09-23 MED ORDER — DULOXETINE 60 MG CAPSULE,DELAYED RELEASE
60.0000 mg | DELAYED_RELEASE_CAPSULE | Freq: Every day | ORAL | 0 refills | Status: DC
Start: 2021-09-23 — End: 2021-12-23

## 2021-09-23 MED ORDER — HYDROXYZINE PAMOATE 25 MG CAPSULE
25.0000 mg | ORAL_CAPSULE | Freq: Every evening | ORAL | 0 refills | Status: DC | PRN
Start: 2021-09-23 — End: 2021-12-06

## 2021-09-23 MED ORDER — ZOLPIDEM 10 MG TABLET
10.0000 mg | ORAL_TABLET | Freq: Every evening | ORAL | 1 refills | Status: DC
Start: 2021-09-23 — End: 2021-12-06

## 2021-09-23 NOTE — Progress Notes (Signed)
[place]  Operated by [facility]  Progress Note    Name: Karen Gilbert MRN:  [id number]   Date: 09/23/2021 Age: 41 y.o.       Chief Complaint: Generalized Anxiety (Mood Disorder) and Bipolar Disorder (Personality Disorder/)    Subjective:   Present illness:  Patient here for follow-up on her depression, anxiety, insomnia.  Today the patient states that she is doing "pretty good."  She is limping today and is complaining of right foot pain.   she has ongoing depression and anxiety due to numerous medical issues.  She states that she has an ulcer on her right foot that may need a wound specialist.  She is also worried about having a toe amputated.  She is scheduled to have a partial hysterectomy next month.  She has chronic pain which contributes to her symptoms as well.  She states she has been frustrated with family recently and has been trying to maintain boundaries.  She also has some financial stressors and states that she recently lost her food stamps and has been having some car trouble.  Most of the appointment was spent allowing her time to vent her frustration about her ongoing medical issues and frustration with family.  Offered support and encouragement which easily accepts.  Overall she is coping well and does not want to make any medication changes today.  No reports of any increase in irritability or mood swings.   She states she is no longer working and has applied for disability.     She denies any suicidal or homicidal ideations today.  She denies any plan or intent to self-harm.  She declined inpatient treatment.  Risk benefits, side effect potential reiterated with good understanding verbalized.  We discussed side effect potential of antipsychotics including EPS, NMS, TD and metabolic syndrome reiterated with good understanding verbalized.  Benzodiazepine precautions reiterated with good understanding  verbalized.    She would like to follow-up in 3 months or sooner should there be any new problems or complications.  Refills sent to the pharmacy today.  No reports of panic attacks.     She has a good appetite and has been eating well.    She denies any auditory or visual hallucinations, delusions or paranoia.     No other complaints voiced.      PHQ9=119      Board of Pharmacy:  looks appropriate on 09/23/2021  UDS obtained on 07/04/2019 and positive for alprazolam as expected the patient had reported taking Neurontin as well which did not show up in the urine drug screen.  Otherwise appears appropriate and no illicit substances noted.     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 daily  Vistaril 25-50 milligrams p.o. nightly as needed  Ambien 10 mg p.o. 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  They are alert to time place person and situation.  Speech is within normal limits.  Mood is "okay" affect is congruent.  There are no auditory or visual tactile hallucinations.  Thought content is within normal limits there is no reported self-harm thoughts no reported suicidal thoughts.  No aggressive thoughts.  Axis linear logical goal-directed.  Associations within normal limits.  Language within normal limits.  Recent memory is intact as evidenced by can state the  name of provider and reports with a had for the last meal.  Remote memory is intact as evidenced by able to report when married.  Immediate memory is intact as evidenced by provider correct details of the current situation.  Intellectual functioning is good as evidenced by can name the president, vice president and vocabulary is consistent with known level of education.  Insight is fair as evidenced by sees self as having a psychiatric illness; aware of symptoms; and senses  level of illness affects her functioning.  Judgment is fair as evidenced by has been compliant with treatment.  Objective :  BP (!) 142/86 (Site: Right, Patient Position: Sitting, Cuff Size: Adult Large)   Pulse 84   Resp 18   Ht 1.803 m ('5\' 11"'$ )   Wt 109 kg (240 lb)   SpO2 98%   BMI 33.47 kg/m         Data reviewed:    Current Outpatient Medications   Medication Sig   . albuterol sulfate (PROVENTIL OR VENTOLIN OR PROAIR) 90 mcg/actuation Inhalation oral inhaler Take 90 Puffs by inhalation Twice daily   . ALPRAZolam (XANAX) 1 mg Oral Tablet Take 1 Tablet (1 mg total) by mouth Three times a day as needed for Insomnia   . amLODIPine (NORVASC) 2.5 mg Oral Tablet Take 1 Tablet (2.5 mg total) by mouth Once a day   . benzocaine (ORAJEL SEVERE) 20 % Mucous Membrane Gel Take 40 mg by mouth Once a day   . bethanechol chloride (URECHOLINE) 25 mg Oral Tablet Take 25 mg combined by mouth Twice daily   . budesonide-formoteroL (SYMBICORT) 80-4.5 mcg/actuation Inhalation oral inhaler Take 2 Puffs by inhalation Twice daily   . cholecalciferol, vitamin D3, 1,250 mcg (50,000 unit) Oral Capsule Take 1 Capsule (50,000 Units total) by mouth Every 7 days   . dextromethorphan-quinidine (NUEDEXTA) 20-10 mg Oral Capsule Take 1 Capsule by mouth Once a day   . dextromethorphan-quinidine (NUEDEXTA) 20-10 mg Oral Capsule Take 1 Capsule by mouth Once a day   . Diclofenac Sodium (VOLTAREN XR) 100 mg Oral Tablet Sustained Release 24 hr Take 1 Tablet (100 mg total) by  mouth Once a day   . dilTIAZem (CARDIZEM CD) 300 mg Oral Capsule, Sust. Release 24 hr Take 1 Capsule (300 mg total) by mouth Once a day   . Diltiazem HCl (TIAZAC) 300 mg Oral Capsule,Sustained Action 24 hr Take 1 Capsule (300 mg total) by mouth Once a day   . docusate sodium (COLACE) 100 mg Oral Capsule Take 1 Capsule (100 mg total) by mouth Once a day   . DULoxetine (CYMBALTA DR) 60 mg Oral Capsule, Delayed Release(E.C.) Take 1 Capsule (60 mg total) by mouth Once a day   . erenumab-aooe (AIMOVIG AUTOINJECTOR) 140 mg/mL Subcutaneous Auto-Injector Inject 1 mL (140 mg total) under the skin Every 30 days   . ergocalciferol, vitamin D2, (  DRISDOL) 1,250 mcg (50,000 unit) Oral Capsule Take 1 Capsule (50,000 Units total) by mouth Every 7 days   . ezetimibe (ZETIA) 10 mg Oral Tablet Take 1 Tablet (10 mg total) by mouth Every evening   . famotidine (PEPCID) 40 mg Oral Tablet Take 1 Tablet (40 mg total) by mouth Once a day (Patient not taking: Reported on 09/23/2021)   . ferrous sulfate (FERATAB) 324 mg (65 mg iron) Oral Tablet, Delayed Release (E.C.) Take 1 Tablet (324 mg total) by mouth Every morning before breakfast   . fluticasone (FLOVENT) 110 mcg/actuation Inhalation Aerosol oral inhaler Take 1 Puff by inhalation Twice daily   . furosemide (LASIX) 20 mg Oral Tablet Take 1 Tablet (20 mg total) by mouth Once a day   . gabapentin (NEURONTIN) 600 mg Oral Tablet Take 1 Tablet (600 mg total) by mouth Three times a day Two caps three times daily   . glimepiride (AMARYL) 2 mg Oral Tablet Take 1 Tablet (2 mg total) by mouth Three times daily with meals   . Glyburide-Metformin 5-500 mg Oral Tablet Take 2 Tablets by mouth Twice daily   . hydrochlorothiazide (HYDRODIURIL) 25 mg Oral Tablet Take 1 Tablet (25 mg total) by mouth Once a day   . HYDROcodone-acetaminophen (NORCO) 10-325 mg Oral Tablet Take 1 Tablet by mouth Every 6 hours as needed for Pain   . hydrOXYchloroQUINE (PLAQUENIL) 200 mg Oral Tablet Take 1 Tablet (200 mg total)  by mouth Once a day   . hydrOXYzine pamoate (VISTARIL) 25 mg Oral Capsule Take 1 Capsule (25 mg total) by mouth Every night as needed 25-50 mg QHS as needed.   . Ibuprofen (MOTRIN) 800 mg Oral Tablet Take 1 Tablet (800 mg total) by mouth Every 8 hours   . insulin aspart (NOVOLOG FLEXPEN U-100 INSULIN SUBQ) Inject 1 Units under the skin Three times daily with meals   . INSULIN DETEMIR (LEVEMIR FLEXPEN SUBQ) 60 Units by Subcutaneous route   . insulin glargine U-300 conc (TOUJEO SOLOSTAR U-300 INSULIN) 300 unit/mL (1.5 mL) Subcutaneous Insulin Pen Inject 10 Units under the skin Once a day   . lurasidone (LATUDA) 80 mg Oral Tablet Take 1 Tablet (80 mg total) by mouth Once a day Must administer with food (at least 350 calories)   . magnesium oxide (MAG-OX) 400 mg Oral Tablet Take 1 Tablet (400 mg total) by mouth Twice daily   . metFORMIN (GLUCOPHAGE) 850 mg Oral Tablet Take 1 Tablet (850 mg total) by mouth Three times daily with meals   . Olmesartan-Hydrochlorothiazide 40-12.5 mg Oral Tablet Take 1 Tablet by mouth Once a day   . ondansetron (ZOFRAN ODT) 4 mg Oral Tablet, Rapid Dissolve Take 1 Tablet (4 mg total) by mouth Every 8 hours as needed for Nausea/Vomiting   . pantoprazole (PROTONIX) 40 mg Oral Tablet, Delayed Release (E.C.) Take 1 Tablet (40 mg total) by mouth Once a day   . pioglitazone (ACTOS) 45 mg Oral Tablet Take 1 Tablet (45 mg total) by mouth Once a day   . polyethylene glycol (MIRALAX) 17 gram/dose Oral Powder Take by mouth Once per day as needed (constipation)   . rimegepant (NURTEC ODT) 75 mg Oral Tablet, Rapid Dissolve Take 1 Tablet (75 mg total) by mouth Once, as needed (migraine)   . rosuvastatin (CRESTOR) 20 mg Oral Tablet Take 1 Tablet (20 mg total) by mouth Every evening   . sucralfate (CARAFATE) 1 gram Oral Tablet Take 1 Tablet (1 g total) by mouth Once a day   .  Sumatriptan Succinate (IMITREX) 100 mg Oral Tablet Take 1 Tablet (100 mg total) by mouth Once, as needed for Migraine May repeat in 2  hours in needed   . thyroid (ARMOUR THYROID) 180 mg Oral Tablet Take 1 Tablet (180 mg total) by mouth Once a day   . topiramate (TOPAMAX) 50 mg Oral Tablet Take 1.5 Tablets (75 mg total) by mouth Twice daily   . zolpidem (AMBIEN) 10 mg Oral Tablet Take 1 Tablet (10 mg total) by mouth Every night     Assessment/Plan  Continue cymbalta 60 mg daily, Nuedexta 20/10 mg daily,  xanax 1 mg 3 times daily, and continue Latuda 80 mg daily.  Vistaril 25-'50mg'$  p.o. nightly as needed for sleep, Ambien 10 mg p.o. nightly sent in to [facility].  Patient given written and verbal instructions regarding good sleep hygiene.  Reportable signs and symptoms, risks, benefits and potential side effects reiterated with good understanding verbalized.  Patient agreeable to following up in 3 months or sooner should there be any new problems or complications.  Pt verbalized understanding, denied questions, agreed to treatment plan.    Continue medications as ordered.  Referred patient to [facility] for counseling due to numerous psychosocial stressors.  Advised patient to contact clinic in case of need and crisis line in case of emergency  Patient is at low risk of self harm and does not warrant inpatient hospitalization at this time and does not present as dangerous to self / others or gravely disabled at this time.  Advised to take all medications as direct and keep all follow-up appointments for primary and specialty medical care.  Advised to notify MD or go to nearest ER for new/worsening symptoms.  Advised to avoid alcohol, tobacco, and illicit drugs.  After standard discussion of risks, benefits, side effects, indications, therapeutic rationale, and alternatives (including doing nothing), patient gives informed consent re: medications.  Patient verbalized understanding and agreement with treatment plan.    Moderate level of medical decision making includes review of old records, discussion of diagnosis and symptoms,  review of PHQ-9, review of symptoms, patient education, discussion of prescirbed medications and side effects, discussion of psychosocial stressors, and review of prescription monitoring program information. I offered support and encouragement.    Danett Palazzo, PMHNP-BC

## 2021-10-05 ENCOUNTER — Other Ambulatory Visit (HOSPITAL_COMMUNITY): Payer: Self-pay | Admitting: NURSE PRACTITIONER

## 2021-10-05 ENCOUNTER — Ambulatory Visit: Payer: Medicaid Other | Attending: NURSE PRACTITIONER

## 2021-10-05 ENCOUNTER — Ambulatory Visit (HOSPITAL_COMMUNITY)
Admission: RE | Admit: 2021-10-05 | Discharge: 2021-10-05 | Disposition: A | Discharge status: Home / Self Care | Payer: Medicaid Other | Source: Ambulatory Visit

## 2021-10-05 ENCOUNTER — Ambulatory Visit (HOSPITAL_COMMUNITY)
Admission: RE | Admit: 2021-10-05 | Discharge: 2021-10-05 | Disposition: A | Discharge status: Home / Self Care | Payer: Medicaid Other | Source: Ambulatory Visit | Attending: NURSE PRACTITIONER | Admitting: NURSE PRACTITIONER

## 2021-10-05 ENCOUNTER — Other Ambulatory Visit: Payer: Self-pay

## 2021-10-05 DIAGNOSIS — Z01818 Encounter for other preprocedural examination: Secondary | ICD-10-CM | POA: Insufficient documentation

## 2021-10-05 DIAGNOSIS — Z01811 Encounter for preprocedural respiratory examination: Secondary | ICD-10-CM

## 2021-10-05 LAB — URINALYSIS, MACROSCOPIC
BILIRUBIN: NEGATIVE mg/dL
BLOOD: NEGATIVE mg/dL
GLUCOSE: >1000 mg/dL — AB
LEUKOCYTES: NEGATIVE WBCs/uL
NITRITE: NEGATIVE
PH: 5.5 (ref 5.0–9.0)
PROTEIN: 20 mg/dL
SPECIFIC GRAVITY: 1.032 — ABNORMAL HIGH (ref 1.002–1.030)
UROBILINOGEN: NORMAL mg/dL

## 2021-10-05 LAB — ECG 12 LEAD
Atrial Rate: 94 {beats}/min
Calculated P Axis: 65 degrees
Calculated R Axis: 49 degrees
Calculated T Axis: 56 degrees
PR Interval: 136 ms
QRS Duration: 90 ms
QT Interval: 346 ms
QTC Calculation: 432 ms
Ventricular rate: 94 {beats}/min

## 2021-10-05 LAB — URINALYSIS, MICROSCOPIC
RBCS: 1 /hpf (ref <4)
SQUAMOUS EPITHELIAL: 3 /hpf (ref <28)
WBCS: 1 /hpf (ref <6)

## 2021-10-06 ENCOUNTER — Ambulatory Visit: Payer: Medicaid Other | Attending: Obstetrics & Gynecology

## 2021-10-06 DIAGNOSIS — Z01818 Encounter for other preprocedural examination: Secondary | ICD-10-CM | POA: Insufficient documentation

## 2021-10-06 LAB — BASIC METABOLIC PANEL
ANION GAP: 7 mmol/L — ABNORMAL LOW (ref 10–20)
BUN/CREA RATIO: 11 (ref 6–22)
BUN: 8 mg/dL (ref 7–25)
CALCIUM: 9 mg/dL (ref 8.6–10.3)
CHLORIDE: 100 mmol/L (ref 98–107)
CO2 TOTAL: 27 mmol/L (ref 21–31)
CREATININE: 0.73 mg/dL (ref 0.60–1.30)
ESTIMATED GFR: 105 mL/min/{1.73_m2} (ref >59)
GLUCOSE: 363 mg/dL — ABNORMAL HIGH (ref 74–109)
OSMOLALITY, CALCULATED: 281 mOsm/kg (ref 270–290)
POTASSIUM: 3.9 mmol/L (ref 3.5–5.1)
SODIUM: 134 mmol/L — ABNORMAL LOW (ref 136–145)

## 2021-10-06 LAB — CBC WITH DIFF
BASOPHIL #: 0 10*3/uL (ref 0.00–0.30)
BASOPHIL %: 1 % (ref 0–3)
EOSINOPHIL #: 0.2 10*3/uL (ref 0.00–0.80)
EOSINOPHIL %: 2 % (ref 0–7)
HCT: 36.3 % — ABNORMAL LOW (ref 37.0–47.0)
HGB: 11.8 g/dL — ABNORMAL LOW (ref 12.5–16.0)
LYMPHOCYTE #: 2.1 10*3/uL (ref 1.10–5.00)
LYMPHOCYTE %: 27 % (ref 25–45)
MCH: 25.6 pg — ABNORMAL LOW (ref 27.0–32.0)
MCHC: 32.5 g/dL (ref 32.0–36.0)
MCV: 79 fL (ref 78.0–99.0)
MONOCYTE #: 0.7 10*3/uL (ref 0.00–1.30)
MONOCYTE %: 9 % (ref 0–12)
MPV: 7.8 fL (ref 7.4–10.4)
NEUTROPHIL #: 4.7 10*3/uL (ref 1.80–8.40)
NEUTROPHIL %: 62 % (ref 40–76)
PLATELETS: 355 10*3/uL (ref 140–440)
RBC: 4.59 10*6/uL (ref 4.20–5.40)
RDW: 14.1 % (ref 11.6–14.8)
WBC: 7.6 10*3/uL (ref 4.0–10.5)
WBCS UNCORRECTED: 7.6 10*3/uL

## 2021-10-06 NOTE — H&P (Signed)
Document sent from office to be scanned in

## 2021-10-07 ENCOUNTER — Ambulatory Visit (HOSPITAL_COMMUNITY): Payer: Medicaid Other | Admitting: Anesthesiology

## 2021-10-07 ENCOUNTER — Ambulatory Visit
Admission: RE | Admit: 2021-10-07 | Discharge: 2021-10-07 | Disposition: A | Discharge status: Home / Self Care | Payer: Medicaid Other | Source: Ambulatory Visit | Attending: Obstetrics & Gynecology | Admitting: Obstetrics & Gynecology

## 2021-10-07 ENCOUNTER — Encounter (HOSPITAL_COMMUNITY)
Admission: RE | Disposition: A | Discharge status: Home / Self Care | Payer: Self-pay | Source: Ambulatory Visit | Attending: Obstetrics & Gynecology

## 2021-10-07 ENCOUNTER — Other Ambulatory Visit: Payer: Self-pay

## 2021-10-07 DIAGNOSIS — N920 Excessive and frequent menstruation with regular cycle: Secondary | ICD-10-CM | POA: Insufficient documentation

## 2021-10-07 DIAGNOSIS — D259 Leiomyoma of uterus, unspecified: Secondary | ICD-10-CM | POA: Insufficient documentation

## 2021-10-07 DIAGNOSIS — D271 Benign neoplasm of left ovary: Secondary | ICD-10-CM | POA: Insufficient documentation

## 2021-10-07 DIAGNOSIS — N879 Dysplasia of cervix uteri, unspecified: Secondary | ICD-10-CM | POA: Insufficient documentation

## 2021-10-07 LAB — HCG, URINE QUALITATIVE, PREGNANCY: HCG URINE QUALITATIVE: NEGATIVE

## 2021-10-07 LAB — POC BLOOD GLUCOSE (RESULTS)
GLUCOSE, POC: 341 mg/dl (ref 50–500)
GLUCOSE, POC: 288 mg/dl (ref 50–500)

## 2021-10-07 SURGERY — HYSTERECTOMY  VAGINAL LAPAROSCOPIC ASSISTED
Anesthesia: General | Site: Vagina | Wound class: Clean Contaminated Wounds-The respiratory, GI, Genital, or urinary

## 2021-10-07 MED ORDER — DULOXETINE 30 MG CAPSULE,DELAYED RELEASE
60.0000 mg | DELAYED_RELEASE_CAPSULE | Freq: Once | ORAL | Status: AC
Start: 2021-10-07 — End: 2021-10-07
  Administered 2021-10-07: 60 mg via ORAL

## 2021-10-07 MED ORDER — HYDROMORPHONE 2 MG/ML INJECTION WRAPPER
1.0000 mg | INJECTION | INTRAMUSCULAR | Status: DC | PRN
Start: 2021-10-07 — End: 2021-10-07

## 2021-10-07 MED ORDER — ALBUTEROL SULFATE 2.5 MG/3 ML (0.083 %) SOLUTION FOR NEBULIZATION
2.5000 mg | INHALATION_SOLUTION | Freq: Once | RESPIRATORY_TRACT | Status: DC | PRN
Start: 2021-10-07 — End: 2021-10-07

## 2021-10-07 MED ORDER — ROPIVACAINE (PF) 2 MG/ML (0.2 %) INJECTION SOLUTION
INTRAMUSCULAR | Status: AC
Start: 2021-10-07 — End: 2021-10-07
  Filled 2021-10-07: qty 10

## 2021-10-07 MED ORDER — PROPOFOL 10 MG/ML IV BOLUS
INJECTION | Freq: Once | INTRAVENOUS | Status: DC | PRN
Start: 2021-10-07 — End: 2021-10-07
  Administered 2021-10-07: 170 mg via INTRAVENOUS
  Administered 2021-10-07: 30 mg via INTRAVENOUS

## 2021-10-07 MED ORDER — ONDANSETRON HCL (PF) 4 MG/2 ML INJECTION SOLUTION
INTRAMUSCULAR | Status: AC
Start: 2021-10-07 — End: 2021-10-07
  Filled 2021-10-07: qty 2

## 2021-10-07 MED ORDER — OXYCODONE-ACETAMINOPHEN 5 MG-325 MG TABLET
2.0000 | ORAL_TABLET | ORAL | Status: DC | PRN
Start: 2021-10-07 — End: 2021-10-07
  Administered 2021-10-07: 1 via ORAL
  Filled 2021-10-07: qty 2

## 2021-10-07 MED ORDER — LACTATED RINGERS INTRAVENOUS SOLUTION
INTRAVENOUS | Status: DC
Start: 2021-10-07 — End: 2021-10-07

## 2021-10-07 MED ORDER — FENTANYL (PF) 50 MCG/ML INJECTION SOLUTION
INTRAMUSCULAR | Status: AC
Start: 2021-10-07 — End: 2021-10-07
  Filled 2021-10-07: qty 2

## 2021-10-07 MED ORDER — LIDOCAINE (PF) 100 MG/5 ML (2 %) INTRAVENOUS SYRINGE
INJECTION | Freq: Once | INTRAVENOUS | Status: DC | PRN
Start: 2021-10-07 — End: 2021-10-07
  Administered 2021-10-07: 80 mg via INTRAVENOUS

## 2021-10-07 MED ORDER — SODIUM CHLORIDE 0.9 % (FLUSH) INJECTION SYRINGE
3.0000 mL | INJECTION | INTRAMUSCULAR | Status: DC | PRN
Start: 2021-10-07 — End: 2021-10-07

## 2021-10-07 MED ORDER — METRONIDAZOLE 500 MG/100 ML IN SODIUM CHLOR(ISO) INTRAVENOUS PIGGYBACK
500.0000 mg | INJECTION | Freq: Once | INTRAVENOUS | Status: AC
Start: 2021-10-07 — End: 2021-10-07
  Administered 2021-10-07: 500 mg via INTRAVENOUS

## 2021-10-07 MED ORDER — LIDOCAINE 1 %-EPINEPHRINE 1:100,000 INJECTION SOLUTION
Freq: Once | INTRAMUSCULAR | Status: DC | PRN
Start: 2021-10-07 — End: 2021-10-07
  Administered 2021-10-07: 10 mL via INTRAMUSCULAR

## 2021-10-07 MED ORDER — SODIUM CHLORIDE 0.9 % INTRAVENOUS PIGGYBACK
2.0000 g | Freq: Once | INTRAVENOUS | Status: AC
Start: 2021-10-07 — End: 2021-10-07
  Administered 2021-10-07: 2 g via INTRAVENOUS

## 2021-10-07 MED ORDER — PROCHLORPERAZINE EDISYLATE 10 MG/2 ML (5 MG/ML) INJECTION SOLUTION
5.0000 mg | Freq: Once | INTRAMUSCULAR | Status: DC | PRN
Start: 2021-10-07 — End: 2021-10-07
  Administered 2021-10-07: 5 mg via INTRAVENOUS

## 2021-10-07 MED ORDER — ONDANSETRON HCL (PF) 4 MG/2 ML INJECTION SOLUTION
4.0000 mg | Freq: Once | INTRAMUSCULAR | Status: DC | PRN
Start: 2021-10-07 — End: 2021-10-07
  Administered 2021-10-07: 4 mg via INTRAVENOUS

## 2021-10-07 MED ORDER — SODIUM CHLORIDE 0.9 % INTRAVENOUS PIGGYBACK
INJECTION | INTRAVENOUS | Status: AC
Start: 2021-10-07 — End: 2021-10-07
  Filled 2021-10-07: qty 100

## 2021-10-07 MED ORDER — SODIUM CHLORIDE 0.9 % (FLUSH) INJECTION SYRINGE
3.0000 mL | INJECTION | Freq: Three times a day (TID) | INTRAMUSCULAR | Status: DC
Start: 2021-10-07 — End: 2021-10-07

## 2021-10-07 MED ORDER — FAMOTIDINE (PF) 20 MG/2 ML INTRAVENOUS SOLUTION
INTRAVENOUS | Status: AC
Start: 2021-10-07 — End: 2021-10-07
  Filled 2021-10-07: qty 2

## 2021-10-07 MED ORDER — INSULIN REGULAR HUMAN 100 UNIT/ML INJECTION - CHARGE BY DOSE
10.0000 [IU] | Freq: Once | INTRAMUSCULAR | Status: AC
Start: 2021-10-07 — End: 2021-10-07
  Administered 2021-10-07: 10 [IU] via SUBCUTANEOUS

## 2021-10-07 MED ORDER — METRONIDAZOLE 500 MG/100 ML IN SODIUM CHLOR(ISO) INTRAVENOUS PIGGYBACK
INJECTION | INTRAVENOUS | Status: AC
Start: 2021-10-07 — End: 2021-10-07
  Filled 2021-10-07: qty 100

## 2021-10-07 MED ORDER — SUGAMMADEX 100 MG/ML INTRAVENOUS SOLUTION
Freq: Once | INTRAVENOUS | Status: DC | PRN
Start: 2021-10-07 — End: 2021-10-07
  Administered 2021-10-07: 500 mg via INTRAVENOUS

## 2021-10-07 MED ORDER — MIDAZOLAM 5 MG/ML INJECTION WRAPPER
INTRAMUSCULAR | Status: AC
Start: 2021-10-07 — End: 2021-10-07
  Filled 2021-10-07: qty 1

## 2021-10-07 MED ORDER — FENTANYL (PF) 50 MCG/ML INJECTION WRAPPER
50.0000 ug | INJECTION | INTRAMUSCULAR | Status: DC | PRN
Start: 2021-10-07 — End: 2021-10-07
  Administered 2021-10-07: 50 ug via INTRAVENOUS

## 2021-10-07 MED ORDER — SODIUM CHLORIDE 0.9 % INJECTION SOLUTION
INTRAMUSCULAR | Status: AC
Start: 2021-10-07 — End: 2021-10-07
  Filled 2021-10-07: qty 10

## 2021-10-07 MED ORDER — PROCHLORPERAZINE EDISYLATE 10 MG/2 ML (5 MG/ML) INJECTION SOLUTION
10.0000 mg | Freq: Four times a day (QID) | INTRAMUSCULAR | Status: DC | PRN
Start: 2021-10-07 — End: 2021-10-07

## 2021-10-07 MED ORDER — ACETAMINOPHEN 1,000 MG/100 ML (10 MG/ML) INTRAVENOUS SOLUTION
INTRAVENOUS | Status: AC
Start: 2021-10-07 — End: 2021-10-07
  Filled 2021-10-07: qty 100

## 2021-10-07 MED ORDER — GABAPENTIN 600 MG TABLET
ORAL_TABLET | ORAL | Status: AC
Start: 2021-10-07 — End: 2021-10-07
  Filled 2021-10-07: qty 1

## 2021-10-07 MED ORDER — INSULIN NPH AND REG HUMAN INSULIN 100 UNIT/ML (70-30)SUBCUTANEOUS - CHARGE BY DOSE
SUBCUTANEOUS | Status: AC
Start: 2021-10-07 — End: 2021-10-07
  Filled 2021-10-07: qty 10

## 2021-10-07 MED ORDER — ROPIVACAINE (PF) 2 MG/ML (0.2 %) INJECTION SOLUTION
Freq: Once | INTRAMUSCULAR | Status: DC | PRN
Start: 2021-10-07 — End: 2021-10-07
  Administered 2021-10-07: 20 mL via INTRAMUSCULAR

## 2021-10-07 MED ORDER — LABETALOL 20 MG/4 ML (5 MG/ML) INTRAVENOUS SYRINGE
INJECTION | Freq: Once | INTRAVENOUS | Status: DC | PRN
Start: 2021-10-07 — End: 2021-10-07
  Administered 2021-10-07: 5 mg via INTRAVENOUS

## 2021-10-07 MED ORDER — LIDOCAINE 1 %-EPINEPHRINE 1:100,000 INJECTION SOLUTION
INTRAMUSCULAR | Status: AC
Start: 2021-10-07 — End: 2021-10-07
  Filled 2021-10-07: qty 50

## 2021-10-07 MED ORDER — ROCURONIUM 10 MG/ML INTRAVENOUS SYRINGE WRAPPER
INJECTION | Freq: Once | INTRAVENOUS | Status: DC | PRN
Start: 2021-10-07 — End: 2021-10-07
  Administered 2021-10-07: 30 mg via INTRAVENOUS
  Administered 2021-10-07: 40 mg via INTRAVENOUS

## 2021-10-07 MED ORDER — PROCHLORPERAZINE EDISYLATE 10 MG/2 ML (5 MG/ML) INJECTION SOLUTION
INTRAMUSCULAR | Status: AC
Start: 2021-10-07 — End: 2021-10-07
  Filled 2021-10-07: qty 2

## 2021-10-07 MED ORDER — FAMOTIDINE (PF) 20 MG/2 ML INTRAVENOUS SOLUTION
20.0000 mg | Freq: Once | INTRAVENOUS | Status: AC
Start: 2021-10-07 — End: 2021-10-07
  Administered 2021-10-07: 20 mg via INTRAVENOUS

## 2021-10-07 MED ORDER — DULOXETINE 30 MG CAPSULE,DELAYED RELEASE
DELAYED_RELEASE_CAPSULE | ORAL | Status: AC
Start: 2021-10-07 — End: 2021-10-07
  Filled 2021-10-07: qty 2

## 2021-10-07 MED ORDER — CEFAZOLIN 1 GRAM SOLUTION FOR INJECTION
INTRAMUSCULAR | Status: AC
Start: 2021-10-07 — End: 2021-10-07
  Filled 2021-10-07: qty 20

## 2021-10-07 MED ORDER — FENTANYL (PF) 50 MCG/ML INJECTION WRAPPER
INJECTION | Freq: Once | INTRAMUSCULAR | Status: DC | PRN
Start: 2021-10-07 — End: 2021-10-07
  Administered 2021-10-07 (×4): 50 ug via INTRAVENOUS

## 2021-10-07 MED ORDER — ONDANSETRON HCL (PF) 4 MG/2 ML INJECTION SOLUTION
4.0000 mg | INTRAMUSCULAR | Status: DC | PRN
Start: 2021-10-07 — End: 2021-10-07

## 2021-10-07 MED ORDER — FENTANYL (PF) 50 MCG/ML INJECTION WRAPPER
25.0000 ug | INJECTION | INTRAMUSCULAR | Status: DC | PRN
Start: 2021-10-07 — End: 2021-10-07

## 2021-10-07 MED ORDER — ACETAMINOPHEN 1,000 MG/100 ML (10 MG/ML) INTRAVENOUS SOLUTION
1000.0000 mg | Freq: Once | INTRAVENOUS | Status: AC
Start: 2021-10-07 — End: 2021-10-07
  Administered 2021-10-07: 1000 mg via INTRAVENOUS

## 2021-10-07 MED ORDER — OXYCODONE-ACETAMINOPHEN 5 MG-325 MG TABLET
1.0000 | ORAL_TABLET | ORAL | Status: DC | PRN
Start: 2021-10-07 — End: 2021-10-07
  Administered 2021-10-07: 1 via ORAL
  Filled 2021-10-07: qty 1

## 2021-10-07 MED ORDER — GABAPENTIN 600 MG TABLET
600.0000 mg | ORAL_TABLET | Freq: Once | ORAL | Status: AC
Start: 2021-10-07 — End: 2021-10-07
  Administered 2021-10-07: 600 mg via ORAL

## 2021-10-07 MED ORDER — MIDAZOLAM 5 MG/ML INJECTION WRAPPER
3.0000 mg | Freq: Once | INTRAMUSCULAR | Status: DC | PRN
Start: 2021-10-07 — End: 2021-10-07
  Administered 2021-10-07: 3 mg via INTRAVENOUS

## 2021-10-07 MED ORDER — IPRATROPIUM 0.5 MG-ALBUTEROL 3 MG (2.5 MG BASE)/3 ML NEBULIZATION SOLN
3.0000 mL | INHALATION_SOLUTION | Freq: Once | RESPIRATORY_TRACT | Status: DC | PRN
Start: 2021-10-07 — End: 2021-10-07

## 2021-10-07 MED ORDER — ONDANSETRON HCL (PF) 4 MG/2 ML INJECTION SOLUTION
4.0000 mg | Freq: Once | INTRAMUSCULAR | Status: AC
Start: 2021-10-07 — End: 2021-10-07
  Administered 2021-10-07: 4 mg via INTRAVENOUS

## 2021-10-07 SURGICAL SUPPLY — 86 items
BAG BIOHAZ RD 30X24IN THK3 MIL C8-10GL LLDPE INFCT WASTE CAN (MED SURG SUPPLIES) ×2
BAG BIOHAZ RD 30X24IN THK3 MIL C8-10GL LLDPE INFCT WASTE CAN PNCT RST (MED SURG SUPPLIES) ×2
BAG DRAIN 2000ML ANTIREFLUX TWR SLIDE TAP PORT BLUNT CANN LF (UROLOGICAL SUPPLIES) ×3 IMPLANT
BAG SUT DVN STRL LF (SUTURE/WOUND CLOSURE) ×2 IMPLANT
BAG SUTURE DEVON STERILE LATEX FREE (SUTURE/WOUND CLOSURE) ×1
BLADE 15 2 END CBNSTL SURG STRL DISP (CUTTING ELEMENTS) ×1
BLADE 15 2 END CBNSTL SURG STRL DISP (SURGICAL CUTTING SUPPLIES) ×2 IMPLANT
BLADE SURG CLPR PVT ADJ HEAD 9661 STRL LF  DISP PURP (MED SURG SUPPLIES) ×2 IMPLANT
BLADE SURG CLPR PVT ADJ HEAD 9661 STRL LF DISP PURP (MED SURG SUPPLIES) ×1
CATH URETH DOVER 16FR FOLEY 2W LRG SMOOTH DRAIN EYE FIRM TIP SIL 10ML STRL LF  BLU STRP CLR (UROLOGICAL SUPPLIES) ×2 IMPLANT
CATH URETH DOVER 16FR FOLEY 2W_LRG SMOOTH DRAIN EYE FIRM TIP (UROLOGICAL SUPPLIES) ×1
CATH URETH DOVER 18FR FOLEY 2W LRG SMOOTH DRAIN EYE FIRM TIP SIL 10ML STRL LF  BLU STRP CLR (UROLOGICAL SUPPLIES) ×2 IMPLANT
CATH URETH DOVER 18FR FOLEY 2W_LRG SMOOTH DRAIN EYE FIRM TIP (UROLOGICAL SUPPLIES) ×1
CLEANER INSTR PREPZYME MUL-TRD CONTAINR NARSL NEUT PH BDGR (MISCELLANEOUS PT CARE ITEMS) ×1
CONV USE 102436 - NEEDLE HYPO  22GA 1.5IN STD MONOJECT SS POLYPROP REG BVL LL HUB UL SHRP ANTICORE BLU STRL LF  DISP (MED SURG SUPPLIES) ×2 IMPLANT
CONV USE ITEM 321025 - PAD XL 40X20X1IN DERMAPROX POSITION THE PNK PAD 1 LFT SHEET 1 STEP ARM PRTC TRND NONST DISP PIGAZZI (MED SURG SUPPLIES) ×2 IMPLANT
CONV USE ITEM 321852 - GLOVE SURG 6.5 LF  PLISPRN (GLOVES AND ACCESSORIES) ×6 IMPLANT
CONV USE ITEM 321854 - GLOVE SURG 6 LF  BEAD CUF SMOOTH HI GRIP WHT 12IN MDCHC PLISPRN (GLOVES AND ACCESSORIES) ×4 IMPLANT
CONV USE ITEM 321983 - GLOVE SURG 8 LTX CHEMO PF SMOOTH BEAD CUF STRL WHT 11.6IN PLMR THK.2MM THK.21MM (GLOVES AND ACCESSORIES) ×2 IMPLANT
CONV USE ITEM 329146 - CLEANER INSTR PREPZYME MUL-TRD CONTAINR NARSL NEUT PH BDGR 22OZ (MISCELLANEOUS PT CARE ITEMS) ×2 IMPLANT
CONV USE ITEM 343591 - SOLIDIFY FLUID 1500CC NONST LF  PREM SOLIDIFY + (MED SURG SUPPLIES) ×2 IMPLANT
CONV USE ITEM 81225 - BAG BIOHAZ RD 30X24IN THK3 MIL C8-10GL LLDPE INFCT WASTE CAN PNCT RST (MED SURG SUPPLIES) ×2 IMPLANT
COUNTER 20 CNT BLOCK ADH NEEDLE STRL LF  RD SHARP FOAM 15.75X11.5X14IN DISP (MED SURG SUPPLIES) ×2 IMPLANT
COUNTER 20 CNT BLOCK ADH NEEDLE STRL LF RD SHARP FOAM 15.75 (MED SURG SUPPLIES) ×1
COVER TBL 90X50IN STD SMS REINF FNFLD STRL LF  DISP (DRAPE/PACKS/SHEETS/OR TOWEL) ×4 IMPLANT
COVER TBL 90X50IN STD SMS REINF FNFLD STRL LF DISP (DRAPE/PACKS/SHEETS/OR TOWEL) ×2
DEVICE LAPSCP VOYANT MARYLAND 37CM 1 ACT CURVE JAW DSCT TIP (INSTRUMENTS ENDOMECHANICAL) ×1
DEVICE LAPSCP VOYANT MARYLAND 37CM 1 ACT CURVE JAW DSCT TIP FUS  7MM 18MM 20MM 5MM TROCAR (ENDOSCOPIC SUPPLIES) ×2 IMPLANT
DISC USE 162338 - NEEDLE HYPO 18GA 1.5IN TW BD_POLYPROP REG BVL LL HUB (MED SURG SUPPLIES) ×1
DISCONTINUED USE 31829 - NEEDLE HYPO 18GA 1.5IN TW BD_POLYPROP REG BVL LL HUB (MED SURG SUPPLIES) ×2 IMPLANT
DISCONTINUED USE ITEM 320289 - PAD EG 15SQ IN STD UNIV FOAM NONCORD NONREM SAF RING ADULT 9100 SER (SURGICAL CUTTING SUPPLIES) ×2 IMPLANT
DURAPREP 26ML 8630 CS/20 (MED SURG SUPPLIES) ×1
ENDOSCP MNTN CLEARIFY 8X6IN WA_RM HUB 2 CLTH TROCAR WP MRFBR (INSTRUMENTS ENDOMECHANICAL) ×1
GLOVE SURG 6 LF PF SMOOTH STRL WHT PLISPRN (GLOVES AND ACCESSORIES) ×2
GLOVE SURG 6.5 LF PF SMOOTH STRL WHT PLISPRN (GLOVES AND ACCESSORIES) ×3
GLOVE SURG 6.5 LTX PF BEAD CUF MICRO ROUGHEN N-PYRG STRL STRW BGL SRG CURVE FINGER (GLOVES AND ACCESSORIES) ×2 IMPLANT
GLOVE SURG 6.5 LTX PF BEAD CUF_MICRO RGH N-PYRG STRL STRW (GLOVES AND ACCESSORIES) ×1
GLOVE SURG 7 LF  PF SMOOTH TXTR BEAD CUF STRL GRN 12IN SENSICARE PI PLISPRN SYN PLMR ALOE THK7.9 MIL (GLOVES AND ACCESSORIES) ×2 IMPLANT
GLOVE SURG 7 LF PF SMOOTH TXTR BEAD CUF STRL GRN 12IN (GLOVES AND ACCESSORIES) ×1
GLOVE SURG 8 LTX PF SMOOTH STRL CRM (GLOVES AND ACCESSORIES) ×1
GOWN SURG 2XL XLNG LGTH L3 HKLP CLSR RGLN SLEEVE TWL STRL LF (DRAPE/PACKS/SHEETS/OR TOWEL) ×1
GOWN SURG 2XL XLNG LGTH L3 HKLP CLSR RGLN SLEEVE TWL STRL LF  DISP GRN AERO BLU PRFRM FBRC (DRAPE/PACKS/SHEETS/OR TOWEL) ×2 IMPLANT
HDPE THK22 UM C40-45 GL L48 IN X W40 IN NATURAL (MISCELLANEOUS PT CARE ITEMS) ×6 IMPLANT
IMG CLEARIFY 8X6IN WARM HUB TROCAR WIPE MRFBR SYSTEM DISP (ENDOSCOPIC SUPPLIES) ×2 IMPLANT
JELLY LUB EZ BCTRST H2O SOL NG_RS FLPTP TUBE STRL 4OZ LF (MED SURG SUPPLIES) ×1
JELLY LUB EZ BCTRST WATER SOL NGRS FLIPTOP TUBE STRL 4OZ LF (MED SURG SUPPLIES) ×2 IMPLANT
LABEL MED CORRECT MED LABELING SYS 4 FLG 2 SHEET 24 PRPRNT (MED SURG SUPPLIES) ×1
LABEL MED CORRECT MED LABELING SYS 4 FLG 2 SHEET 24 PRPRNT STRL (MED SURG SUPPLIES) ×2 IMPLANT
LINER SUCT MEDIVAC CRD TW LOCK LID SHTOF VALVE CAN PORT 3L LF  DISP (MED SURG SUPPLIES) ×2 IMPLANT
LINER SUCT MEDIVAC CRD TW LOCK_LID SHTOF VALVE CAN PORT 3L (MED SURG SUPPLIES) ×1
NEEDLE HYPO  22GA 1.5IN STD MONOJECT SS POLYPROP REG BVL LL HUB UL SHRP ANTICORE BLU STRL LF  DISP (MED SURG SUPPLIES) ×1
NEEDLE HYPO 22GA 1.5IN STD MONOJECT SS POLYPROP REG BVL LL (MED SURG SUPPLIES) ×1
NEEDLE SPINAL YW 3.5IN 20GA QUINCKE REG WL POLYPROP STRL LF  DISP (MED SURG SUPPLIES) ×2 IMPLANT
NEEDLE SPINAL YW 3.5IN 20GA QU_INCKE REG WL POLYPROP REG BVL (MED SURG SUPPLIES) ×1
PACK SURG BSC VAG DEL LF (MED SURG SUPPLIES) ×1
PACK SURG SIRUS OB III REINF TBL CVR GRAD UNDERBUTTOCK STRL 76X44IN 46X33.5IN POLY LF (MED SURG SUPPLIES) ×2 IMPLANT
PAD EG 15SQ IN STD UNIV FOAM NONCORD NONREM SAF RING ADULT 9100 SER (CUTTING ELEMENTS) ×2
PAD GROUNDING ADULT UNCORDED_1149 100EA/CS (CUTTING ELEMENTS) ×1
PAD XL 40X20X1IN DERMAPROX POSITION THE PNK PAD 1 LFT SHEET (MED SURG SUPPLIES) ×1
PAD XL 40X20X1IN DERMAPROX POSITION THE PNK PAD 1 LFT SHEET 1 STEP ARM PRTC TRND NONST DISP PIGAZZI (MED SURG SUPPLIES) ×2
RETRACTR RND 9.5CM GELPOINT TRNVG ACCESS PLATFORM INSTR SHIELD VAGINAL (MED SURG SUPPLIES) ×2 IMPLANT
RETRACTR RND 9.5CM GELPOINT TR_NVG ACCESS PLATFORM INSTR (MED SURG SUPPLIES) ×1
SET .241IN 77IN NONVENT PIERCE PIN LRG BORE TUBE SIGHT (MED SURG SUPPLIES) ×1
SET .241IN 77IN NONVENT PIERCE PIN LRG BORE TUBE SIGHT CHAMBER GRVTY FLOW IRRG CSCP BLADDER STRL LF (MED SURG SUPPLIES) ×2 IMPLANT
SET TUBING PNEUMOCLEAR HIFLO SMOKE EVAC (MED SURG SUPPLIES) ×2 IMPLANT
SET TUBING PNEUMOCLEAR HIFLO S_MOKE EVAC DEMOTE (MED SURG SUPPLIES) ×1
SOL IRRG 0.9% NACL 1000ML PLASTIC PR BTL PRSV FR DEHP-FR AQLT LF (MEDICATIONS/SOLUTIONS) ×2 IMPLANT
SOL IRRG 0.9% NACL 1000ML PRSV FR DEHP-FR STRL AQLT LF (MEDICATIONS/SOLUTIONS) ×1
SOL IV 0.9% NACL 500ML STRL PRSV FR FLXB CONTAINR LF (MEDICATIONS/SOLUTIONS) ×3 IMPLANT
SOL SURG PREP 26ML DRPRP 74% ISPRP 0.7% IOD POVACRYLEX SLF CNTN APPL SKIN STRL PREOP (MED SURG SUPPLIES) ×2 IMPLANT
SOLIDIFY FLUID 1500CC NONST LF  PREM SOLIDIFY + (MED SURG SUPPLIES) ×2
SOLIDIFY FLUID 1500CC NONST LF PREM SOLIDIFY + (MED SURG SUPPLIES) ×1
SPONGE LAP 18X4IN PREWASH RADOPQ WO RING GAUZE STRL LF (MED SURG SUPPLIES) ×3 IMPLANT
SPONGE SURG 4X4IN 16 PLY XRY DETECT COTTON STRL LF  DISP (WOUND CARE SUPPLY) ×2 IMPLANT
SPONGE SURG 4X4IN 16 PLY_RADOPQ COT STRL LF DISP (WOUND CARE/ENTEROSTOMAL SUPPLY) ×1
SUTURE 0 GS-21 POLYSRB 36IN VIOL BRD COAT ABS (SUTURE/WOUND CLOSURE) ×3 IMPLANT
SUTURE 0 GS-21 POLYSRB D-TACH 18IN VIOL BRD 5 STRN COAT ABS (SUTURE/WOUND CLOSURE) ×6 IMPLANT
SUTURE 2-0 GS-22 POLYSRB 30IN VIOL BRD COAT ABS (SUTURE/WOUND CLOSURE) ×6 IMPLANT
SYRINGE LL 10ML LF  STRL GRAD N-PYRG DEHP-FR PVC FREE MED DISP (MED SURG SUPPLIES) ×4 IMPLANT
SYRINGE LL 10ML LF STRL MED D_ISP (MED SURG SUPPLIES) ×2
TOWEL 24X16IN COTTON BLU DISP SURG STRL LF (DRAPE/PACKS/SHEETS/OR TOWEL) ×3 IMPLANT
TUBE BUBBLE CONNECTING_8888280214 1EA/BX/CS (MED SURG SUPPLIES) ×1
TUBING SUCT CLR 100FT 3/16IN ARGYLE UNIV PVC NCDTV BBL NONST LF (MED SURG SUPPLIES) ×2 IMPLANT
TUBING SUCT CLR 6FT .25IN ARGYLE PVC NCDTV STR MALE FEMALE (MED SURG SUPPLIES) ×2
TUBING SUCT CLR 6FT .25IN ARGYLE PVC NCDTV STR MALE FEMALE MLD CONN STRL LF (MED SURG SUPPLIES) ×4 IMPLANT
URETH SUP ADV FIT DIL PSHR DEL_DEV HNDL CURVE NEEDLE TIP (IMPLANTS GYNECOLOGIC) ×1

## 2021-10-07 NOTE — Anesthesia Postprocedure Evaluation (Signed)
Anesthesia Post Op Evaluation    Patient: Karen Gilbert  Procedure(s):  LAPAROSCOPIC ASSISTED VAGINAL NOTES HYSTERECTOMY; BILATERAL SALPINGECTOMY; LEFT OOPHORECTOMY  CYSTOSCOPY    Last Vitals:Temperature: 36.7 C (98.1 F) (10/07/21 1104)  Heart Rate: 82 (10/07/21 1110)  BP (Non-Invasive): (!) 188/96 (10/07/21 1110)  Respiratory Rate: (!) 22 (10/07/21 1110)  SpO2: 97 % (10/07/21 1110)    No notable events documented.      Patient location during evaluation: bedside       Patient participation: complete - patient participated  Level of consciousness: awake and alert    Pain management: satisfactory to patient  Airway patency: patent    Anesthetic complications: no  Cardiovascular status: hemodynamically stable  Respiratory status: acceptable  Hydration status: acceptable  Patient post-procedure temperature: Pt Normothermic   PONV Status: Absent

## 2021-10-07 NOTE — OR PreOp (Signed)
HAS A WOUND BOTTOM OF RIGHT GREAT TOE DARK IN COLOR PT REPORT HAS APPOINTMENT TO SEE DR. MULLINS LATER ON CONCERNING THIS

## 2021-10-07 NOTE — Interval H&P Note (Signed)
[facility]       H&P UPDATE FORM                                                                                    Patient's Name: Karen Gilbert 41 y.o. female  Date of Admission:  10/07/2021  Patient's Date of Birth: 1980-06-22    10/07/2021       H & P updated the day of the procedure.  1.  H&P completed within 30 days of surgical procedure  and has been reviewed within 24 hours of the surgery, the patient has been examined, and no change has occured in the patients condition since the H&P was completed.       Change in medications: No    2.  Patient continues to be appropriate candidate for planned surgical procedure. YES        Caren Griffins, DO, PhD, [name]

## 2021-10-07 NOTE — Discharge Instructions (Addendum)
Follow up with Dr Theodosia Quay on 10/21/21 @ 10:15 am    Change peri pad as needed    Unlimited ambulation.    Daily showers.    Pelvic rest until seen at follow up appointment.    Call for problems, questions, concerns.    Call for increased pain, temperature greater than 101F, bleeding greater than 1 pad/hour, foul smelling discharge    Resume home meds.    RX for home sent in to your pharmacy.

## 2021-10-07 NOTE — OR Surgeon (Signed)
[facility]  Operative Note    Patient Name: Karen Gilbert, Karen Gilbert [facility] Number: [id number]  Date of Service: 10/07/2021   Date of Birth: 15-Jul-1980      Pre-Operative Diagnosis: MENORRHAGIA, HISTORY OF ABLATION     Post-Operative Diagnosis: MENORRHAGIA, HISTORY OF ABLATION    Procedure(s)/Description:  LAPAROSCOPIC ASSISTED VAGINAL NOTES HYSTERECTOMY; BILATERAL SALPINGECTOMY; LEFT OOPHORECTOMY: 58552 (CPT)  CYSTOSCOPY: 52000 (CPT)     Findings:  Normal appearing external female genitalia, vaginal mucosa, cervix.  On laparoscopy the abdominal survey was normal appearance.  Bilateral tubes are normal appearance.  Right ovary was normal in appearance.  Left ovary was enlarged.  On cystoscopy the bladder held 200 cc of sterile saline with no evidence of injury.  There was spill of urine from bilateral ureteral orifices.    Attending Surgeon: Caren Griffins, DO     Anesthesia:  Anesthesiologist: Cornett, Exie Parody, MD  CRNA: Darcel Smalling, CRNA    Anesthesia Type: .General     Estimated Blood Loss:  less than 100 ml    Specimens Removed:   ID Type Source Tests Collected by Time Destination   1 : UTERUS WITH CERVIX; BILATERAL FALLOPIAN TUBES, LEFT OVARY Tissue Uterus SURGICAL PATHOLOGY SPECIMEN Caren Griffins, DO 10/07/2021 1039       Order Name Source Comment Collection Info Order Time   HCG, URINE QUALITATIVE, PREGNANCY Urine, Site not specified  Collected By: Caprice Beaver, RN 10/07/2021  7:44 AM     Release to patient   Automated        SURGICAL PATHOLOGY SPECIMEN Uterus Pre-op diagnosis:  MENORRHAGIA, HISTORY OF ABLATION Collected By: Caren Griffins, DO 10/07/2021 10:46 AM     Release to patient   Automated             Complications:  None      Condition: stable    Disposition: PACU - hemodynamically stable.    Description of Procedure:    After confirming ability consents patient was transferred the operating room placed on the operating table.  After adequate  general anesthesia patient's legs were placed in stirrups and she was sterilely prepped and draped in usual fashion.  Surgical timeout was performed.  A Foley catheter was inserted.  A short weighted speculum and Deaver were placed into the vagina to visualize the cervix and grasped on the anterior lip using a Jacobs tenaculum.  This was then repeated on the posterior lip.  The uterosacral ligaments were identified and injected with 10 cc of 0.2% ropivacaine diluted one-to-one with normal saline at each uterosacral ligament.  The cervical vaginal junction was identified and injected with 10 cc of 1% lidocaine with 1-100,000 epinephrine in a circumferential fashion.  The vaginal mucosa was then incised in a circumferential fashion and the bladder was displaced cephalad using a Ray-Tec.  The Ray-Tec was removed and the peritoneal reflection was noted and the peritoneum was incised sharply without difficulty.  This was then stretched and the peritoneum was tagged to the vaginal mucosa using 0 Vicryl.  The Deaver was then placed under the peritoneum.  The posterior cul-de-sac was entered sharply and extended manually.  The peritoneum was run with 0 Vicryl for good hemostasis.  The duckbill weighted speculum was placed into the posterior colpotomy.  The uterosacral ligament was identified on the left and grasped with a Heaney clamp transected using Mayo scissors and suture ligated and placed on the stay.  This was then repeated on the opposite side.  The the path vaginal retractor was placed over the Moro tenaculums and inserted into the anterior cul-de-sac by inserting the applied retractor which assisted in placement of the anterior portion of the ring.  The Deaver was removed and the retractor was removed.  This was then repeated on the posterior aspect without difficulty.  The gel Was connected after inserting the trochars at 2 6 and 10.  The pneumoperitoneum was created to 10 mmHg and the patient was placed in 20  degrees of Trendelenburg.  The 10 mm laparoscope was introduced along with the [employer] bipolar device and a [employer].  The cardinal ligament was identified grasped coagulated and transected on the patient's left side and this was carried up to the midportion of the peritoneum on the broad ligament.  This was then repeated on the opposite side side which was carried up to the level of the utero-ovarian ligament.  Attention was turned back to the opposite side and successive bites using coagulation and transection were carried out through the utero-ovarian ligament freeing up the left side of the uterus.  The fallopian tube on that side was identified grasped and then coagulated and transected at the mesosalpinx.  The IP ligament on the left side was identified grasped coagulated and transected with good hemostasis freeing the uterus from the left pelvic sidewall.  The uterus cervix bilateral fallopian tubes from the sidewall and the abdomen.  They were retracted through the Alexis retractor without difficulty.  The pneumoperitoneum was deflated and the gel cap was Removed just prior to removing the specimens.  The V Path retractor was removed and the vaginal cuff was closed in a vertical fashion using 0 Vicryl in a running locking fashion.  Good hemostasis was noted.  The Foley catheter was removed and a cystoscopy was completed with the findings as noted above.  All sponge and counts were correct x3.  All specimens were sent for pathologic evaluation.  The patient was awakened and transported to PACU in stable condition.    Caren Griffins D.O., Ph.D., [name]

## 2021-10-07 NOTE — Anesthesia Transfer of Care (Signed)
ANESTHESIA TRANSFER OF CARE   Karen Gilbert is a 41 y.o. ,female, Weight: 109 kg (240 lb)   had Procedure(s):  LAPAROSCOPIC ASSISTED VAGINAL NOTES HYSTERECTOMY; BILATERAL SALPINGECTOMY; LEFT OOPHORECTOMY  CYSTOSCOPY  performed  10/07/21   Primary Service: [facility] Ensign*    Past Medical History:   Diagnosis Date   . Diabetes mellitus type II    . HTN (hypertension)    . Migraine       Allergy History as of 10/07/21     IBUPROFEN       Noted Status Severity Type Reaction    08/26/21 2232 Carvel Getting, RN 07/26/12 Active Low  Swelling    Comments: Swelling in legs     07/26/12 1351 Oscar La, [place] 88/91/69 Active       Comments: Swelling in legs           CODEINE       Noted Status Severity Type Reaction    08/12/21 1016 Drue Flirt, [place] 12/31/20 Active High  Itching          NAPROXEN       Noted Status Severity Type Reaction    08/12/21 1016 Drue Flirt, [place] 08/23/18 Active High  Swelling          HEPATITIS B VIRUS VACCINE, RECOMBINANT       Noted Status Severity Type Reaction    09/07/21 1717 Frederick Peers, RN 09/07/21 Deleted High  Shortness of Breath, Rash    09/07/21 1711 Frederick Peers, RN 09/07/21 Active High  Shortness of Breath, Rash          INFLUENZA VIRUS VACCINES       Noted Status Severity Type Reaction    09/07/21 1717 Frederick Peers, RN 09/07/21 Deleted    Other Adverse Reaction (Add comment)    Comments: unknown     09/07/21 1712 Frederick Peers, RN 09/07/21 Active    Other Adverse Reaction (Add comment)    Comments: unknown           PENICILLINS       Noted Status Severity Type Reaction    09/07/21 1718 Frederick Peers, RN 09/07/21 Deleted Low  Hives/ Urticaria    09/07/21 1712 Frederick Peers, RN 09/07/21 Active Low  Hives/ Urticaria              I completed my transfer of care / handoff to the receiving personnel during which we discussed:  Access, Airway, All key/critical aspects of case discussed, Analgesia, Antibiotics, Expectation of post procedure, Fluids/Product, Gave  opportunity for questions and acknowledgement of understanding, Labs and PMHx    Post Location: PACU                                                                  Last OR Temp: Temperature: 36.7 C (98.1 F)  ABG:  POTASSIUM   Date Value Ref Range Status   10/06/2021 3.9 3.5 - 5.1 mmol/L Final   07/26/2012 3.8 3.5 - 5.1 mmol/L Final     KETONES   Date Value Ref Range Status   10/05/2021 Trace Negative, Trace mg/dL Final   07/26/2012 NEGATIVE NEGATIVE mg/dL Final     CALCIUM   Date Value Ref Range Status   10/06/2021 9.0 8.6 - 10.3  mg/dL Final   07/26/2012 9.5 8.5 - 10.4 mg/dL Final     Calculated P Axis   Date Value Ref Range Status   10/05/2021 65 degrees Final     Calculated R Axis   Date Value Ref Range Status   10/05/2021 49 degrees Final     Calculated T Axis   Date Value Ref Range Status   10/05/2021 56 degrees Final     Airway:* No LDAs found *  Blood pressure (!) 154/98, pulse 82, temperature 36.7 C (98.1 F), resp. rate 15, height 1.803 m ('5\' 11"'$ ), weight 109 kg (240 lb), SpO2 100 %.

## 2021-10-07 NOTE — Nurses Notes (Signed)
Report given and care endorsed to [place]

## 2021-10-07 NOTE — Anesthesia Preprocedure Evaluation (Signed)
ANESTHESIA PRE-OP EVALUATION  Planned Procedure: LAPAROSCOPIC ASSISTED VAGINAL NOTES HYSTERECTOMY; (Abdomen)  CYSTOSCOPY (Vagina )  Review of Systems                   Pulmonary     Cardiovascular    Hypertension and hyperlipidemia ,       GI/Hepatic/Renal           Endo/Other    hypothyroidism,  type 1 diabetes,     Neuro/Psych/MS    headaches, anxiety, depression, bipolar disorder     Cancer                      Physical Assessment      Plan  ASA 3     Planned anesthesia type: general     general anesthesia with endotracheal tube intubation

## 2021-10-07 NOTE — OR PreOp (Signed)
O.T. 341 AT 0803

## 2021-10-08 DIAGNOSIS — N920 Excessive and frequent menstruation with regular cycle: Secondary | ICD-10-CM

## 2021-10-08 LAB — SURGICAL PATHOLOGY SPECIMEN

## 2021-10-15 ENCOUNTER — Ambulatory Visit
Admission: RE | Admit: 2021-10-15 | Discharge: 2021-10-15 | Disposition: A | Discharge status: Home / Self Care | Payer: Medicaid Other | Source: Ambulatory Visit | Attending: NURSE PRACTITIONER | Admitting: NURSE PRACTITIONER

## 2021-10-15 ENCOUNTER — Encounter (INDEPENDENT_AMBULATORY_CARE_PROVIDER_SITE_OTHER): Payer: Self-pay | Admitting: Surgery

## 2021-10-15 ENCOUNTER — Other Ambulatory Visit: Payer: Self-pay

## 2021-10-15 ENCOUNTER — Other Ambulatory Visit (HOSPITAL_COMMUNITY): Payer: Self-pay | Admitting: NURSE PRACTITIONER

## 2021-10-15 DIAGNOSIS — M549 Dorsalgia, unspecified: Secondary | ICD-10-CM | POA: Insufficient documentation

## 2021-10-18 ENCOUNTER — Other Ambulatory Visit: Payer: Self-pay

## 2021-10-18 ENCOUNTER — Encounter (INDEPENDENT_AMBULATORY_CARE_PROVIDER_SITE_OTHER): Payer: Self-pay | Admitting: Surgery

## 2021-10-18 ENCOUNTER — Ambulatory Visit (INDEPENDENT_AMBULATORY_CARE_PROVIDER_SITE_OTHER): Payer: Medicaid Other | Admitting: Surgery

## 2021-10-18 VITALS — BP 159/101 | HR 95 | Temp 98.3°F | Resp 18 | Ht 71.0 in | Wt 231.0 lb

## 2021-10-18 DIAGNOSIS — Z6832 Body mass index (BMI) 32.0-32.9, adult: Secondary | ICD-10-CM

## 2021-10-18 DIAGNOSIS — L97509 Non-pressure chronic ulcer of other part of unspecified foot with unspecified severity: Secondary | ICD-10-CM

## 2021-10-21 ENCOUNTER — Encounter (INDEPENDENT_AMBULATORY_CARE_PROVIDER_SITE_OTHER): Payer: Self-pay | Admitting: Surgery

## 2021-10-21 NOTE — H&P (Signed)
GENERAL SURGERY, [facility]  [address]  [place] [address]    History and Physical     Name: Karen Gilbert MRN:  [id number]   Date: 10/18/2021 Age: 41 y.o.                Reason for Visit: Skin Problem (Pressure ulcers both feet)    History of Present Illness  Karen Gilbert presents today evaluation of callused areas on the plantar surface of the great toe.  Initially was present on both great toes but the callus on the left has now came off.  Mostly by itself but she states she also has been trying to pull it off herself.  Has been trying to pull on the right and it has not similarly come off.    She has no history of trauma.  No fever.  No drainage.  No previous diabetic ulcer that required intervention.  No pain.    No previous diagnosis of peripheral vascular disease.  No imaging studies        MEDICAL DECISION:    Review of prior external note(s) from each unique source:  Patients referral to this office including a recent assessment by the referring provider.  This was reviewed by me for this unique office visit for the indication and intent of the referral as well as any pertinent medical or surgical history relevant to the patients independent evaluation by me today.        Patient Data  Patient History  Past Medical History:   Diagnosis Date   . Asthma    . Diabetes mellitus type II    . HTN (hypertension)    . Hyperlipidemia    . Migraine    . Myocardial infarction (CMS [facility])          Past Surgical History:   Procedure Laterality Date   . HX CHOLECYSTECTOMY     . HX SHOULDER ARTHROSCOPY     . HX TONSILLECTOMY     . HX TUBAL LIGATION           Current Outpatient Medications   Medication Sig   . albuterol sulfate (PROVENTIL OR VENTOLIN OR PROAIR) 90 mcg/actuation Inhalation oral inhaler Take 2 Puffs by inhalation Every 6 hours as needed   . ALPRAZolam (XANAX) 1 mg Oral Tablet Take 1 Tablet (1 mg total) by mouth Three times a day as needed for Insomnia   .  amLODIPine (NORVASC) 2.5 mg Oral Tablet Take 1 Tablet (2.5 mg total) by mouth Once a day   . benzocaine (ORAJEL SEVERE) 20 % Mucous Membrane Gel Take 1 Each by mouth Once per day as needed   . bethanechol chloride (URECHOLINE) 25 mg Oral Tablet Take 1 Tablet (25 mg total) by mouth Twice daily   . budesonide-formoteroL (SYMBICORT) 80-4.5 mcg/actuation Inhalation oral inhaler Take 2 Puffs by inhalation Twice daily   . cholecalciferol, vitamin D3, 1,250 mcg (50,000 unit) Oral Capsule Take 1 Capsule (50,000 Units total) by mouth Every 7 days   . dextromethorphan-quinidine (NUEDEXTA) 20-10 mg Oral Capsule Take 1 Capsule by mouth Once a day   . Diltiazem HCl (TIAZAC) 300 mg Oral Capsule,Sustained Action 24 hr Take 1 Capsule (300 mg total) by mouth Once a day   . docusate sodium (COLACE) 100 mg Oral Capsule Take 1 Capsule (100 mg total) by mouth Once a day   . DULoxetine (CYMBALTA DR) 60 mg Oral Capsule, Delayed Release(E.C.) Take 1 Capsule (60 mg  total) by mouth Once a day   . erenumab-aooe (AIMOVIG AUTOINJECTOR) 140 mg/mL Subcutaneous Auto-Injector Inject 1 mL (140 mg total) under the skin Every 30 days   . ezetimibe (ZETIA) 10 mg Oral Tablet Take 1 Tablet (10 mg total) by mouth Every evening   . furosemide (LASIX) 20 mg Oral Tablet Take 1 Tablet (20 mg total) by mouth Once per day as needed   . gabapentin (NEURONTIN) 600 mg Oral Tablet Take 1 Tablet (600 mg total) by mouth Three times a day Two caps three times daily   . hydrochlorothiazide (HYDRODIURIL) 25 mg Oral Tablet Take 1 Tablet (25 mg total) by mouth Once per day as needed   . hydrOXYchloroQUINE (PLAQUENIL) 200 mg Oral Tablet Take 1 Tablet (200 mg total) by mouth Once a day   . hydrOXYzine pamoate (VISTARIL) 25 mg Oral Capsule Take 1 Capsule (25 mg total) by mouth Every night as needed 25-50 mg QHS as needed.   . Ibuprofen (MOTRIN) 800 mg Oral Tablet Take 1 Tablet (800 mg total) by mouth Three times a day as needed   . insulin aspart (NOVOLOG FLEXPEN U-100  INSULIN SUBQ) Inject 1 Units under the skin Per instructions (INSULIN PUMP)   . lurasidone (LATUDA) 80 mg Oral Tablet Take 1 Tablet (80 mg total) by mouth Once a day Must administer with food (at least 350 calories)   . magnesium oxide (MAG-OX) 400 mg Oral Tablet Take 1 Tablet (400 mg total) by mouth Twice daily   . Olmesartan-Hydrochlorothiazide 40-12.5 mg Oral Tablet Take 1 Tablet by mouth Once a day   . ondansetron (ZOFRAN ODT) 4 mg Oral Tablet, Rapid Dissolve Take 1 Tablet (4 mg total) by mouth Every 8 hours as needed for Nausea/Vomiting   . pantoprazole (PROTONIX) 40 mg Oral Tablet, Delayed Release (E.C.) Take 1 Tablet (40 mg total) by mouth Once a day   . polyethylene glycol (MIRALAX) 17 gram/dose Oral Powder Take by mouth Once per day as needed (constipation)   . rimegepant (NURTEC ODT) 75 mg Oral Tablet, Rapid Dissolve Take 1 Tablet (75 mg total) by mouth Once, as needed (migraine)   . rosuvastatin (CRESTOR) 20 mg Oral Tablet Take 1 Tablet (20 mg total) by mouth Every evening   . sucralfate (CARAFATE) 1 gram Oral Tablet Take 1 Tablet (1 g total) by mouth Once a day   . thyroid (ARMOUR THYROID) 180 mg Oral Tablet Take 1 Tablet (180 mg total) by mouth Once a day   . topiramate (TOPAMAX) 50 mg Oral Tablet Take 1.5 Tablets (75 mg total) by mouth Twice daily   . ubrogepant (UBRELVY) 50 mg Oral Tablet Take 1 Tablet (50 mg total) by mouth Once, as needed   . zolpidem (AMBIEN) 10 mg Oral Tablet Take 1 Tablet (10 mg total) by mouth Every night     Allergies   Allergen Reactions   . Codeine Itching   . Naproxen Swelling   . Motrin [Ibuprofen] Swelling     Swelling in legs     Family Medical History:    None         Social History     Tobacco Use   . Smoking status: Never   . Smokeless tobacco: Never   Vaping Use   . Vaping Use: Never used   Substance Use Topics   . Alcohol use: No   . Drug use: Never            Physical Examination:  Vitals:  10/18/21 1322   BP: (!) 159/101   Pulse: 95   Resp: 18   Temp: 36.8 C  (98.3 F)   SpO2: 97%   Weight: 105 kg (231 lb)   Height: 1.803 m ('5\' 11"'$ )   BMI: 32.29      General: appropriate for age. in no acute distress.    Vital signs are present above and have been reviewed by me     HEENT: Atraumatic, Normocephalic.    Lungs: Nonlabored breathing with symmetric expansion    Heart:Regular wth respect to rate and rythmn.  Palpable dorsalis pedis and posterior tibial pulses bilaterally    Abdomen:Soft. Nontender. Nondistended     Extremity:    Circular callused area on the plantar surface of the right great toe with no open ulceration.  No surrounding induration or erythema.  No expressible drainage.  The previous similar area on the left great toe has healed with a small residual area where the previous callus was present    Psychiatric: Alert and oriented to person, place, and time. affect appropriate      Assessment and Plan    ICD-10-CM    1. Ulcer of great toe, unspecified laterality, unspecified ulcer stage (CMS HCC)  L97.509           Does not need any operative intervention at the current time.  No evidence of arterial insufficiency.  Would continue local wound care to the residual callus on the right great toe.  I would be happy to re-evaluate at any time.    I appreciate the opportunity to be involved in the care of your patients.  If you have any questions or concerns regarding this encounter, please do not hesitate to contact me at your convenience.      Bridgett Larsson MD MBA CPE FACS     This note may have been partially generated using M[employer] Fluency Direct system, and there may be some incorrect words, spellings, and punctuation that were not noted in checking the note before saving, though effort was made to avoid such errors.

## 2021-10-22 ENCOUNTER — Ambulatory Visit (HOSPITAL_COMMUNITY): Payer: Self-pay

## 2021-10-26 ENCOUNTER — Other Ambulatory Visit (HOSPITAL_COMMUNITY): Payer: Self-pay | Admitting: NURSE PRACTITIONER

## 2021-10-26 DIAGNOSIS — M542 Cervicalgia: Secondary | ICD-10-CM

## 2021-11-03 ENCOUNTER — Ambulatory Visit (HOSPITAL_COMMUNITY): Payer: Self-pay

## 2021-11-09 ENCOUNTER — Encounter (HOSPITAL_COMMUNITY): Payer: Self-pay

## 2021-11-09 ENCOUNTER — Ambulatory Visit (HOSPITAL_COMMUNITY): Payer: Self-pay

## 2021-11-22 ENCOUNTER — Ambulatory Visit (HOSPITAL_COMMUNITY): Payer: Self-pay

## 2021-12-06 ENCOUNTER — Other Ambulatory Visit (HOSPITAL_PSYCHIATRIC): Payer: Self-pay | Admitting: NURSE PRACTITIONER-PSYCHIATRIC-MENTAL HEALTH

## 2021-12-06 MED ORDER — ALPRAZOLAM 1 MG TABLET
1.0000 mg | ORAL_TABLET | Freq: Three times a day (TID) | ORAL | 1 refills | Status: DC | PRN
Start: 2021-12-06 — End: 2021-12-23

## 2021-12-06 MED ORDER — ZOLPIDEM 10 MG TABLET
10.0000 mg | ORAL_TABLET | Freq: Every evening | ORAL | 1 refills | Status: DC
Start: 2021-12-06 — End: 2021-12-23

## 2021-12-06 MED ORDER — HYDROXYZINE PAMOATE 25 MG CAPSULE
25.0000 mg | ORAL_CAPSULE | Freq: Every evening | ORAL | 1 refills | Status: DC | PRN
Start: 2021-12-06 — End: 2022-01-26

## 2021-12-06 NOTE — Telephone Encounter (Signed)
Karen Gilbert is needing refill for Xanax, Ambien, an Vistaril.  She said the lady at the drugstore said they sent a fax requesting refill. She uses [facility] in [place]

## 2021-12-07 ENCOUNTER — Other Ambulatory Visit (HOSPITAL_PSYCHIATRIC): Payer: Self-pay | Admitting: NURSE PRACTITIONER-PSYCHIATRIC-MENTAL HEALTH

## 2021-12-23 ENCOUNTER — Ambulatory Visit
Payer: Medicaid Other | Attending: NURSE PRACTITIONER-PSYCHIATRIC-MENTAL HEALTH | Admitting: NURSE PRACTITIONER-PSYCHIATRIC-MENTAL HEALTH

## 2021-12-23 ENCOUNTER — Encounter (HOSPITAL_PSYCHIATRIC): Payer: Self-pay | Admitting: NURSE PRACTITIONER-PSYCHIATRIC-MENTAL HEALTH

## 2021-12-23 ENCOUNTER — Other Ambulatory Visit: Payer: Self-pay

## 2021-12-23 VITALS — BP 131/75 | HR 97 | Resp 18 | Ht 71.0 in | Wt 223.0 lb

## 2021-12-23 DIAGNOSIS — F411 Generalized anxiety disorder: Secondary | ICD-10-CM | POA: Insufficient documentation

## 2021-12-23 DIAGNOSIS — F314 Bipolar disorder, current episode depressed, severe, without psychotic features: Secondary | ICD-10-CM

## 2021-12-23 DIAGNOSIS — F609 Personality disorder, unspecified: Secondary | ICD-10-CM | POA: Insufficient documentation

## 2021-12-23 DIAGNOSIS — Z6831 Body mass index (BMI) 31.0-31.9, adult: Secondary | ICD-10-CM

## 2021-12-23 DIAGNOSIS — F3163 Bipolar disorder, current episode mixed, severe, without psychotic features: Secondary | ICD-10-CM | POA: Insufficient documentation

## 2021-12-23 DIAGNOSIS — Z5181 Encounter for therapeutic drug level monitoring: Secondary | ICD-10-CM | POA: Insufficient documentation

## 2021-12-23 DIAGNOSIS — G4700 Insomnia, unspecified: Secondary | ICD-10-CM

## 2021-12-23 MED ORDER — DULOXETINE 30 MG CAPSULE,DELAYED RELEASE
90.0000 mg | DELAYED_RELEASE_CAPSULE | Freq: Every day | ORAL | 1 refills | Status: DC
Start: 2021-12-23 — End: 2022-01-26

## 2021-12-23 MED ORDER — DEXTROMETHORPHAN 20 MG-QUINIDINE 10 MG CAPSULE
1.0000 | ORAL_CAPSULE | Freq: Every day | ORAL | 0 refills | Status: DC
Start: 2021-12-23 — End: 2022-01-26

## 2021-12-23 MED ORDER — ALPRAZOLAM 1 MG TABLET
1.0000 mg | ORAL_TABLET | Freq: Three times a day (TID) | ORAL | 1 refills | Status: DC | PRN
Start: 2021-12-23 — End: 2022-01-26

## 2021-12-23 MED ORDER — LURASIDONE 80 MG TABLET
80.0000 mg | ORAL_TABLET | Freq: Every day | ORAL | 0 refills | Status: DC
Start: 2021-12-23 — End: 2022-01-26

## 2021-12-23 MED ORDER — ZOLPIDEM 10 MG TABLET
10.0000 mg | ORAL_TABLET | Freq: Every evening | ORAL | 1 refills | Status: DC
Start: 2021-12-23 — End: 2022-01-26

## 2021-12-23 NOTE — Progress Notes (Signed)
[place] [place]  Operated by [facility]  Progress Note    Name: Karen Gilbert MRN:  [id number]   Date: 12/23/2021 Age: 41 y.o.       Chief Complaint: Generalized Anxiety and Bipolar Disorder    Subjective:   Present illness:  Patient here for follow-up on her depression, anxiety, insomnia.  Today the patient states that she is doing "okay, a lot has happened."  She is limping today and is complaining of right foot pain.   she has ongoing depression and anxiety due to numerous medical and psychosocial issues.  She states that she has an ulcer on her right foot that may need surgery.  Her daughter has adopted 3 foster children who she helps care for.  One of her friends died recently and she is having some trouble with transportation.  She is sad and tearful today.  She states she feels like she is letting everyone down.  She declined inpatient treatment.  She would like to increase Cymbalta today and would like to follow-up in 1 month.  She was advised to call the office sooner for any increase in symptoms.  She denies any suicidal or homicidal ideations.  No attempts to self-harm. She has chronic pain which contributes to her symptoms as well.   Most of the appointment was spent allowing her time to vent her frustration about her ongoing medical issues and frustration with family.  Offered support and encouragement which easily accepts. No reports of any increase in irritability or mood swings.   She states she is no longer working and has applied for disability.    Risk benefits, side effect potential reiterated with good understanding verbalized.  We discussed side effect potential of antipsychotics including EPS, NMS, TD and metabolic syndrome reiterated with good understanding verbalized.  Benzodiazepine precautions reiterated with good understanding verbalized.    Refills sent to the pharmacy today.  No reports of panic  attacks.     She has a good appetite and has been eating well.    She denies any auditory or visual hallucinations, delusions or paranoia.     No other complaints voiced.      PHQ9=16      Board of Pharmacy:  looks appropriate on 12/23/2021, UDS obtained at today's appointment  UDS obtained on 07/04/2019 and positive for alprazolam as expected the patient had reported taking Neurontin as well which did not show up in the urine drug screen.  Otherwise appears appropriate and no illicit substances noted.     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 daily  Vistaril 25-50 milligrams p.o. nightly as needed  Ambien 10 mg p.o. 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  They are alert to time place person and situation.  Speech is within normal limits.  Mood is "okay" affect is congruent.  There are no auditory or visual tactile hallucinations.  Thought content is within normal limits there is no reported self-harm thoughts no reported suicidal thoughts.  No aggressive thoughts.  Axis linear logical goal-directed.  Associations within normal limits.  Language within normal limits.  Recent memory is intact as evidenced by can state the name of provider and reports with a had for the last meal.  Remote memory is intact as evidenced by able to report when married.  Immediate  memory is intact as evidenced by provider correct details of the current situation.  Intellectual functioning is good as evidenced by can name the president, vice president and vocabulary is consistent with known level of education.  Insight is fair as evidenced by sees self as having a psychiatric illness; aware of symptoms; and senses  level of illness affects her functioning.  Judgment is fair as evidenced by has been compliant with treatment.  Objective :  BP 131/75 (Site: Left, Patient Position: Sitting)   Pulse 97   Resp 18   Ht 1.803 m ('5\' 11"'$ )   Wt 101 kg (223 lb)   BMI 31.10 kg/m         Data reviewed:    Current Outpatient Medications   Medication Sig   . albuterol sulfate (PROVENTIL OR VENTOLIN OR PROAIR) 90 mcg/actuation Inhalation oral inhaler Take 2 Puffs by inhalation Every 6 hours as needed   . ALPRAZolam (XANAX) 1 mg Oral Tablet Take 1 Tablet (1 mg total) by mouth Three times a day as needed for Insomnia   . amLODIPine (NORVASC) 2.5 mg Oral Tablet Take 1 Tablet (2.5 mg total) by mouth Once a day   . benzocaine (ORAJEL SEVERE) 20 % Mucous Membrane Gel Take 1 Each by mouth Once per day as needed   . bethanechol chloride (URECHOLINE) 25 mg Oral Tablet Take 1 Tablet (25 mg total) by mouth Twice daily   . budesonide-formoteroL (SYMBICORT) 80-4.5 mcg/actuation Inhalation oral inhaler Take 2 Puffs by inhalation Twice daily   . cholecalciferol, vitamin D3, 1,250 mcg (50,000 unit) Oral Capsule Take 1 Capsule (50,000 Units total) by mouth Every 7 days   . dextromethorphan-quinidine (NUEDEXTA) 20-10 mg Oral Capsule Take 1 Capsule by mouth Once a day   . Diltiazem HCl (TIAZAC) 300 mg Oral Capsule,Sustained Action 24 hr Take 1 Capsule (300 mg total) by mouth Once a day   . docusate sodium (COLACE) 100 mg Oral Capsule Take 1 Capsule (100 mg total) by mouth Once a day   . DULoxetine (CYMBALTA DR) 60 mg Oral Capsule, Delayed Release(E.C.) Take 1 Capsule (60 mg total) by mouth Once a day   . erenumab-aooe  (AIMOVIG AUTOINJECTOR) 140 mg/mL Subcutaneous Auto-Injector Inject 1 mL (140 mg total) under the skin Every 30 days   . ezetimibe (ZETIA) 10 mg Oral Tablet Take 1 Tablet (10 mg total) by mouth Every evening   . FEROSUL 325 mg (65 mg iron) Oral Tablet Take 1 Tablet (325 mg total) by mouth Once a day   . furosemide (LASIX) 20 mg Oral Tablet Take 1 Tablet (20 mg total) by mouth Once per day as needed   . gabapentin (NEURONTIN) 600 mg Oral Tablet Take 1 Tablet (600 mg total) by mouth Three times a day Two caps three times daily   . hydrochlorothiazide (HYDRODIURIL) 25 mg Oral Tablet Take  1 Tablet (25 mg total) by mouth Once per day as needed   . hydrOXYchloroQUINE (PLAQUENIL) 200 mg Oral Tablet Take 1 Tablet (200 mg total) by mouth Once a day   . hydrOXYzine pamoate (VISTARIL) 25 mg Oral Capsule Take 1 Capsule (25 mg total) by mouth Every night as needed 25-50 mg QHS as needed.   . Ibuprofen (MOTRIN) 800 mg Oral Tablet Take 1 Tablet (800 mg total) by mouth Three times a day as needed   . insulin aspart (NOVOLOG FLEXPEN U-100 INSULIN SUBQ) Inject 1 Units under the skin Per instructions (INSULIN PUMP)   . lurasidone (LATUDA) 80 mg Oral Tablet Take 1 Tablet (80 mg total) by mouth Once a day Must administer with food (at least 350 calories)   . magnesium oxide (MAG-OX) 400 mg Oral Tablet Take 1 Tablet (400 mg total) by mouth Twice daily   . Olmesartan-Hydrochlorothiazide 40-12.5 mg Oral Tablet Take 1 Tablet by mouth Once a day   . ondansetron (ZOFRAN ODT) 4 mg Oral Tablet, Rapid Dissolve Take 1 Tablet (4 mg total) by mouth Every 8 hours as needed for Nausea/Vomiting   . pantoprazole (PROTONIX) 40 mg Oral Tablet, Delayed Release (E.C.) Take 1 Tablet (40 mg total) by mouth Once a day   . polyethylene glycol (MIRALAX) 17 gram/dose Oral Powder Take by mouth Once per day as needed (constipation)   . rimegepant (NURTEC ODT) 75 mg Oral Tablet, Rapid Dissolve Take 1 Tablet (75 mg total) by mouth Once, as needed (migraine)   .  rosuvastatin (CRESTOR) 20 mg Oral Tablet Take 1 Tablet (20 mg total) by mouth Every evening   . sucralfate (CARAFATE) 1 gram Oral Tablet Take 1 Tablet (1 g total) by mouth Once a day   . thyroid (ARMOUR THYROID) 180 mg Oral Tablet Take 1 Tablet (180 mg total) by mouth Once a day   . topiramate (TOPAMAX) 50 mg Oral Tablet Take 1.5 Tablets (75 mg total) by mouth Twice daily   . ubrogepant (UBRELVY) 50 mg Oral Tablet Take 1 Tablet (50 mg total) by mouth Once, as needed   . zolpidem (AMBIEN) 10 mg Oral Tablet Take 1 Tablet (10 mg total) by mouth Every night     Assessment/Plan  Increase Cymbalta to 90 mg daily, Nuedexta 20/10 mg daily,  xanax 1 mg 3 times daily, and continue Latuda 80 mg daily.  Vistaril 25-'50mg'$  p.o. nightly as needed for sleep, Ambien 10 mg p.o. nightly sent in to [facility].  Patient given written and verbal instructions regarding good sleep hygiene.  Reportable signs and symptoms, risks, benefits and potential side effects reiterated with good understanding verbalized.  Patient agreeable to following up in 3 months or sooner should there be any new problems or complications.  Pt verbalized understanding, denied questions, agreed to treatment plan.    Continue medications as ordered.  Referred patient to [facility] for counseling due to numerous psychosocial stressors.  Advised patient to contact clinic in case of need and crisis line in case of emergency  Patient is at low risk of self harm and does not warrant inpatient hospitalization at this time and does not present as dangerous to self / others or gravely disabled at this time.  Advised to take all medications as direct and keep all follow-up appointments for primary and specialty medical care.  Advised to notify MD or go to nearest ER for new/worsening symptoms.  Advised to avoid alcohol, tobacco, and illicit drugs.  After standard discussion of  risks, benefits, side effects, indications, therapeutic rationale, and alternatives  (including doing nothing), patient gives informed consent re: medications.  Patient verbalized understanding and agreement with treatment plan.    Moderate level of medical decision making includes review of old records, discussion of diagnosis and symptoms, review of PHQ-9, review of symptoms, patient education, discussion of prescirbed medications and side effects, discussion of psychosocial stressors, and review of prescription monitoring program information. I offered support and encouragement.    Marcelle Bebout, PMHNP-BC

## 2021-12-23 NOTE — Addendum Note (Signed)
Addended by: Ron Agee B on: 12/23/2021 01:15 PM     Modules accepted: Orders

## 2021-12-25 ENCOUNTER — Other Ambulatory Visit: Payer: Self-pay

## 2021-12-25 ENCOUNTER — Other Ambulatory Visit: Payer: Medicaid Other | Attending: NURSE PRACTITIONER-PSYCHIATRIC-MENTAL HEALTH

## 2021-12-25 ENCOUNTER — Ambulatory Visit (HOSPITAL_COMMUNITY): Payer: Medicaid Other

## 2021-12-25 NOTE — Addendum Note (Signed)
Addended by: Patton Salles on: 12/25/2021 11:42 AM     Modules accepted: Orders

## 2021-12-30 ENCOUNTER — Telehealth (HOSPITAL_PSYCHIATRIC): Payer: Self-pay | Admitting: NURSE PRACTITIONER-PSYCHIATRIC-MENTAL HEALTH

## 2021-12-30 LAB — DRUG MONITORING, PANEL 5, W/CONFIRM, D/L ISOMERS,URINE
Alphahydroxyalprazolam: 100 ng/mL — ABNORMAL HIGH (ref <25)
Alphahydroxymidazolam: NEGATIVE ng/mL (ref <50)
Alphahydroxytriazolam: NEGATIVE ng/mL (ref <50)
Aminoclonazepam: NEGATIVE ng/mL (ref <25)
Amphetamines: NEGATIVE ng/mL (ref <500)
Barbiturates: NEGATIVE ng/mL (ref <300)
Benzodiazepines: POSITIVE ng/mL — AB (ref <100)
Cocaine Metabolite: NEGATIVE ng/mL (ref <100)
Creatinine: 126.4 mg/dL (ref >=20.0)
Hydroxyethylflurazepam: NEGATIVE ng/mL (ref <50)
Lorazepam: NEGATIVE ng/mL (ref <50)
Marijuana Metabolite: NEGATIVE ng/mL (ref <20)
Methadone Metabolite: NEGATIVE ng/mL (ref <100)
Nordiazepam: NEGATIVE ng/mL (ref <50)
Opiates: NEGATIVE ng/mL (ref <100)
Oxazepam: NEGATIVE ng/mL (ref <50)
Oxidant: NEGATIVE ug/mL (ref <200)
Oxycodone: NEGATIVE ng/mL (ref <100)
Temazepam: NEGATIVE ng/mL (ref <50)
pH: 5.4 (ref 4.5–9.0)

## 2021-12-30 LAB — NOTES AND COMMENTS

## 2021-12-30 NOTE — Telephone Encounter (Signed)
Patient called and said that the Cymbalta needs a prior authorization where [place] had increased the dosage.        [phone]

## 2022-01-07 ENCOUNTER — Ambulatory Visit (HOSPITAL_COMMUNITY): Payer: Self-pay

## 2022-01-13 ENCOUNTER — Other Ambulatory Visit (HOSPITAL_COMMUNITY): Payer: Self-pay | Admitting: Family

## 2022-01-13 DIAGNOSIS — K760 Fatty (change of) liver, not elsewhere classified: Secondary | ICD-10-CM

## 2022-01-26 ENCOUNTER — Ambulatory Visit
Payer: Medicaid Other | Attending: NURSE PRACTITIONER-PSYCHIATRIC-MENTAL HEALTH | Admitting: NURSE PRACTITIONER-PSYCHIATRIC-MENTAL HEALTH

## 2022-01-26 ENCOUNTER — Encounter (HOSPITAL_PSYCHIATRIC): Payer: Self-pay | Admitting: NURSE PRACTITIONER-PSYCHIATRIC-MENTAL HEALTH

## 2022-01-26 ENCOUNTER — Other Ambulatory Visit: Payer: Self-pay

## 2022-01-26 VITALS — BP 138/91 | HR 78 | Resp 18 | Ht 71.0 in | Wt 232.0 lb

## 2022-01-26 DIAGNOSIS — F609 Personality disorder, unspecified: Secondary | ICD-10-CM | POA: Insufficient documentation

## 2022-01-26 DIAGNOSIS — F3163 Bipolar disorder, current episode mixed, severe, without psychotic features: Secondary | ICD-10-CM | POA: Insufficient documentation

## 2022-01-26 DIAGNOSIS — F411 Generalized anxiety disorder: Secondary | ICD-10-CM | POA: Insufficient documentation

## 2022-01-26 DIAGNOSIS — G4700 Insomnia, unspecified: Secondary | ICD-10-CM

## 2022-01-26 MED ORDER — DULOXETINE 30 MG CAPSULE,DELAYED RELEASE
90.0000 mg | DELAYED_RELEASE_CAPSULE | Freq: Every day | ORAL | 2 refills | Status: DC
Start: 2022-01-26 — End: 2022-04-28

## 2022-01-26 MED ORDER — HYDROXYZINE PAMOATE 25 MG CAPSULE
25.0000 mg | ORAL_CAPSULE | Freq: Every evening | ORAL | 1 refills | Status: DC | PRN
Start: 2022-01-26 — End: 2022-04-28

## 2022-01-26 MED ORDER — DEXTROMETHORPHAN 20 MG-QUINIDINE 10 MG CAPSULE
1.0000 | ORAL_CAPSULE | Freq: Every day | ORAL | 0 refills | Status: DC
Start: 2022-01-26 — End: 2022-04-28

## 2022-01-26 MED ORDER — ZOLPIDEM 10 MG TABLET
10.0000 mg | ORAL_TABLET | Freq: Every evening | ORAL | 1 refills | Status: DC
Start: 2022-01-26 — End: 2022-04-13

## 2022-01-26 MED ORDER — LURASIDONE 80 MG TABLET
80.0000 mg | ORAL_TABLET | Freq: Every day | ORAL | 0 refills | Status: DC
Start: 2022-01-26 — End: 2022-04-28

## 2022-01-26 MED ORDER — ALPRAZOLAM 1 MG TABLET
1.0000 mg | ORAL_TABLET | Freq: Three times a day (TID) | ORAL | 1 refills | Status: DC | PRN
Start: 2022-01-26 — End: 2022-04-28

## 2022-01-26 NOTE — Progress Notes (Signed)
[place] [place]  Operated by [facility]  Progress Note    Name: Karen Gilbert MRN:  [id number]   Date: 01/26/2022 Age: 41 y.o.       Chief Complaint: Generalized Anxiety and Bipolar Disorder    Subjective:   Present illness:  Patient here for follow-up on her depression, anxiety, insomnia.  Today the patient states that she is doing " I feel awful. "  Today the patient states she is not feeling well physically.  She has had ongoing issues with a wound that will not heal on her foot.  Her granddaughter is with her today for the appointment.She is scheduled to see a wound doctor in [place] soon.  She is not appear to be in any acute physical distress.  she has ongoing depression and anxiety due to numerous medical and psychosocial issues.    We increased Cymbalta at her last appointment which she states was helpful.  She states overall her mood has been stable.  She has had some increased anxiety recently she states mostly due to the appearance of her teeth.She has chronic pain which contributes to her symptoms as well.   Most of the appointment was spent allowing her time to vent her frustration about her ongoing medical issues and frustration with family.  Offered support and encouragement which easily accepts. No reports of any increase in irritability or mood swings.   She states she is no longer working and has applied for disability.    Risk benefits, side effect potential of medication reiterated with good understanding verbalized.  We discussed side effect potential of antipsychotics including EPS, NMS, TD and metabolic syndrome reiterated with good understanding verbalized.  Benzodiazepine precautions reiterated with good understanding verbalized.    Refills sent to the pharmacy today.  No reports of panic attacks.     She has a good appetite and has been eating well.    She denies any suicidal or homicidal ideations.  No  attempts to self-harm.   She denies any auditory or visual hallucinations, delusions or paranoia.     No other complaints voiced.       PHQ9=11        Board of Pharmacy:  looks appropriate on 01/26/2022, UDS obtained 12/25/21 and was positive for benzodiazepines as expected no other illicit substances noted.     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 daily  Vistaril 25-50 milligrams p.o. nightly as needed  Ambien 10 mg p.o. 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  They are alert to time place person and situation.  Speech is within normal limits.  Mood is "okay" affect is congruent.  There are no auditory or visual tactile hallucinations.  Thought content is within normal limits there is no reported self-harm thoughts no reported suicidal thoughts.  No aggressive thoughts.  Axis linear logical goal-directed.  Associations within normal limits.  Language within normal limits.  Recent memory is intact as evidenced by can state the name of provider and reports with a had for the last meal.  Remote memory is intact as evidenced by able to report when married.  Immediate memory is intact as evidenced by provider correct details of the current situation.  Intellectual functioning is good as evidenced by can name the president, vice president and vocabulary is  consistent with known level of education.  Insight is fair as evidenced by sees self as having a psychiatric illness; aware of symptoms; and senses  level of illness affects her functioning.  Judgment is fair as evidenced by has been compliant with treatment.  Objective :  BP (!) 138/91 (Site: Left, Patient Position: Sitting, Cuff Size: Adult)   Pulse 78   Resp 18   Ht 1.803 m ('5\' 11"'$ )   Wt 105 kg (232 lb)   BMI 32.36 kg/m         Data reviewed:    Current Outpatient Medications   Medication Sig    albuterol sulfate (PROVENTIL OR VENTOLIN OR PROAIR) 90 mcg/actuation Inhalation oral inhaler Take 2 Puffs by inhalation Every 6 hours as needed    ALPRAZolam (XANAX) 1 mg Oral Tablet Take 1 Tablet (1 mg total) by mouth Three times a day as needed for Insomnia    amLODIPine (NORVASC) 2.5 mg Oral Tablet Take 1 Tablet (2.5 mg total) by mouth Once a day    benzocaine (ORAJEL SEVERE) 20 % Mucous Membrane Gel Take 1 Each by mouth Once per day as needed    bethanechol chloride (URECHOLINE) 25 mg Oral Tablet Take 1 Tablet (25 mg total) by mouth Twice daily    budesonide-formoteroL (SYMBICORT) 80-4.5 mcg/actuation Inhalation oral inhaler Take 2 Puffs by inhalation Twice daily    cholecalciferol, vitamin D3, 1,250 mcg (50,000 unit) Oral Capsule Take 1 Capsule (50,000 Units total) by mouth Every 7 days    dextromethorphan-quinidine (NUEDEXTA) 20-10 mg Oral Capsule Take 1 Capsule by mouth Once a day    Diltiazem HCl (TIAZAC) 300 mg Oral Capsule,Sustained Action 24 hr Take 1 Capsule (300 mg total) by mouth Once a day    docusate sodium (COLACE) 100 mg Oral Capsule Take 1 Capsule (100 mg total) by mouth Once a day    DULoxetine (CYMBALTA DR) 30 mg Oral Capsule, Delayed Release(E.C.) Take 3 Capsules (90 mg total) by mouth Once a day    erenumab-aooe (AIMOVIG AUTOINJECTOR) 140 mg/mL Subcutaneous Auto-Injector Inject 1 mL (140 mg total) under the skin Every 30 days    ezetimibe (ZETIA) 10 mg Oral Tablet Take 1 Tablet (10 mg total)  by mouth Every evening    FEROSUL 325 mg (65 mg iron) Oral Tablet Take 1 Tablet (325 mg total) by mouth Once a day    furosemide (LASIX) 20 mg Oral Tablet Take 1 Tablet (20 mg total) by mouth Once per day as needed    gabapentin (NEURONTIN) 600 mg Oral Tablet Take 1 Tablet (600 mg total) by mouth Three times a day Two caps three times daily    hydrochlorothiazide (HYDRODIURIL) 25 mg Oral Tablet Take 1 Tablet (25 mg total) by mouth Once per day as needed    hydrOXYchloroQUINE (PLAQUENIL) 200 mg Oral Tablet Take 1  Tablet (200 mg total) by mouth Once a day    hydrOXYzine pamoate (VISTARIL) 25 mg Oral Capsule Take 1 Capsule (25 mg total) by mouth Every night as needed 25-50 mg QHS as needed.    Ibuprofen (MOTRIN) 800 mg Oral Tablet Take 1 Tablet (800 mg total) by mouth Three times a day as needed    insulin aspart (NOVOLOG FLEXPEN U-100 INSULIN SUBQ) Inject 1 Units under the skin Per instructions (INSULIN PUMP)    lurasidone (LATUDA) 80 mg Oral Tablet Take 1 Tablet (80 mg total) by mouth Once a day Must administer with food (at least 350 calories)    magnesium oxide (MAG-OX) 400 mg Oral Tablet Take 1 Tablet (400 mg total) by mouth Twice daily    Olmesartan-Hydrochlorothiazide 40-12.5 mg Oral Tablet Take 1 Tablet by mouth Once a day    ondansetron (ZOFRAN ODT) 4 mg Oral Tablet, Rapid Dissolve Take 1 Tablet (4 mg total) by mouth Every 8 hours as needed for Nausea/Vomiting    pantoprazole (PROTONIX) 40 mg Oral Tablet, Delayed Release (E.C.) Take 1 Tablet (40 mg total) by mouth Once a day    polyethylene glycol (MIRALAX) 17 gram/dose Oral Powder Take by mouth Once per day as needed (constipation)    rimegepant (NURTEC ODT) 75 mg Oral Tablet, Rapid Dissolve Take 1 Tablet (75 mg total) by mouth Once, as needed (migraine)    rosuvastatin (CRESTOR) 20 mg Oral Tablet Take 1 Tablet (20 mg total) by mouth Every evening    sucralfate (CARAFATE) 1 gram Oral Tablet Take 1 Tablet (1 g total) by mouth Once a day    thyroid (ARMOUR  THYROID) 180 mg Oral Tablet Take 1 Tablet (180 mg total) by mouth Once a day    topiramate (TOPAMAX) 50 mg Oral Tablet Take 1.5 Tablets (75 mg total) by mouth Twice daily    ubrogepant (UBRELVY) 50 mg Oral Tablet Take 1 Tablet (50 mg total) by mouth Once, as needed    zolpidem (AMBIEN) 10 mg Oral Tablet Take 1 Tablet (10 mg total) by mouth Every night     Assessment/Plan  Continue Cymbalta  90 mg daily, Nuedexta 20/10 mg daily,  xanax 1 mg 3 times daily, and continue Latuda 80 mg daily.  Vistaril 25-'50mg'$  p.o. nightly as needed for sleep, Ambien 10 mg p.o. nightly sent in to [facility].  Patient given written and verbal instructions regarding good sleep hygiene.  Reportable signs and symptoms, risks, benefits and potential side effects reiterated with good understanding verbalized.  Patient agreeable to following up in 3 months or sooner should there be any new problems or complications.  Pt verbalized understanding, denied questions, agreed to treatment plan.     Continue medications as ordered.  Referred patient to [facility] for counseling due to numerous psychosocial stressors.  Advised patient to contact clinic in case of need and crisis line in case of emergency  Patient is at low risk of self harm and does not warrant inpatient hospitalization at this time and does not present as dangerous to self / others or gravely disabled at this time.  Advised to take all medications as direct and keep all follow-up appointments for primary and specialty medical care.  Advised to notify MD or go to nearest ER for new/worsening symptoms.  Advised to avoid alcohol, tobacco, and illicit drugs.  After standard discussion of risks, benefits, side effects, indications, therapeutic rationale, and alternatives (including doing nothing), patient gives informed consent re: medications.  Patient verbalized understanding  and agreement with treatment plan.     Moderate level of medical decision making includes review  of old records, discussion of diagnosis and symptoms, review of PHQ-9, review of symptoms, patient education, discussion of prescirbed medications and side effects, discussion of psychosocial stressors, and review of prescription monitoring program information. I offered support and encouragement.    Yuritzi Kamp, PMHNP-BC

## 2022-03-31 ENCOUNTER — Other Ambulatory Visit: Payer: Self-pay

## 2022-03-31 ENCOUNTER — Ambulatory Visit
Admission: RE | Admit: 2022-03-31 | Discharge: 2022-03-31 | Disposition: A | Discharge status: Home / Self Care | Payer: Medicaid Other | Source: Ambulatory Visit | Attending: Family | Admitting: Family

## 2022-03-31 ENCOUNTER — Ambulatory Visit (HOSPITAL_COMMUNITY): Payer: Medicaid Other

## 2022-03-31 DIAGNOSIS — K760 Fatty (change of) liver, not elsewhere classified: Secondary | ICD-10-CM | POA: Insufficient documentation

## 2022-03-31 DIAGNOSIS — R935 Abnormal findings on diagnostic imaging of other abdominal regions, including retroperitoneum: Secondary | ICD-10-CM | POA: Insufficient documentation

## 2022-03-31 LAB — CBC
HCT: 39.2 % (ref 31.2–41.9)
HGB: 12.9 g/dL (ref 10.9–14.3)
MCH: 26.9 pg (ref 24.7–32.8)
MCHC: 33 g/dL (ref 32.3–35.6)
MCV: 81.4 fL (ref 75.5–95.3)
MPV: 7.3 fL — ABNORMAL LOW (ref 7.9–10.8)
PLATELETS: 361 10*3/uL (ref 140–440)
RBC: 4.81 10*6/uL (ref 3.63–4.92)
RDW: 14.1 % (ref 12.3–17.7)
WBC: 8.2 10*3/uL (ref 3.8–11.8)

## 2022-03-31 LAB — HEPATIC FUNCTION PANEL
ALBUMIN/GLOBULIN RATIO: 1.3 (ref 0.8–1.4)
ALBUMIN: 4.4 g/dL (ref 3.5–5.7)
ALKALINE PHOSPHATASE: 69 U/L (ref 34–104)
ALT (SGPT): 13 U/L (ref 7–52)
AST (SGOT): 12 U/L — ABNORMAL LOW (ref 13–39)
BILIRUBIN DIRECT: 0.06 md/dL (ref <=0.20)
BILIRUBIN TOTAL: 0.6 mg/dL (ref 0.3–1.2)
BILIRUBIN, INDIRECT: 0.54 mg/dL (ref <=1)
GLOBULIN: 3.4 (ref 2.9–5.4)
PROTEIN TOTAL: 7.8 g/dL (ref 6.4–8.9)

## 2022-04-01 LAB — ALPHA FETOPROTEIN (AFP) TUMOR MARKER: AFP TUMOR MARKER: <2 ng/mL (ref <=9)

## 2022-04-12 ENCOUNTER — Other Ambulatory Visit (HOSPITAL_PSYCHIATRIC): Payer: Self-pay | Admitting: NURSE PRACTITIONER-PSYCHIATRIC-MENTAL HEALTH

## 2022-04-15 ENCOUNTER — Ambulatory Visit: Payer: Medicaid Other | Attending: NURSE PRACTITIONER

## 2022-04-15 ENCOUNTER — Other Ambulatory Visit: Payer: Self-pay

## 2022-04-15 DIAGNOSIS — E559 Vitamin D deficiency, unspecified: Secondary | ICD-10-CM | POA: Insufficient documentation

## 2022-04-15 DIAGNOSIS — D509 Iron deficiency anemia, unspecified: Secondary | ICD-10-CM | POA: Insufficient documentation

## 2022-04-15 DIAGNOSIS — K5900 Constipation, unspecified: Secondary | ICD-10-CM | POA: Insufficient documentation

## 2022-04-15 DIAGNOSIS — E1169 Type 2 diabetes mellitus with other specified complication: Secondary | ICD-10-CM | POA: Insufficient documentation

## 2022-04-16 LAB — VITAMIN D 25 TOTAL: VITAMIN D: 14 ng/mL — ABNORMAL LOW (ref 30–100)

## 2022-04-18 LAB — COMPREHENSIVE METABOLIC PANEL, NON-FASTING
ALBUMIN/GLOBULIN RATIO: 1.3 (ref 0.8–1.4)
ALBUMIN: 4.3 g/dL (ref 3.5–5.7)
ALKALINE PHOSPHATASE: 64 U/L (ref 34–104)
ALT (SGPT): 18 U/L (ref 7–52)
ANION GAP: 9 mmol/L (ref 4–13)
AST (SGOT): 16 U/L (ref 13–39)
BILIRUBIN TOTAL: 0.3 mg/dL (ref 0.3–1.2)
BUN/CREA RATIO: 15 (ref 6–22)
BUN: 11 mg/dL (ref 7–25)
CALCIUM, CORRECTED: 9.2 mg/dL (ref 8.9–10.8)
CALCIUM: 9.4 mg/dL (ref 8.6–10.3)
CHLORIDE: 100 mmol/L (ref 98–107)
CO2 TOTAL: 26 mmol/L (ref 21–31)
CREATININE: 0.73 mg/dL (ref 0.60–1.30)
ESTIMATED GFR: 105 mL/min/{1.73_m2} (ref >59)
GLOBULIN: 3.2 (ref 2.9–5.4)
GLUCOSE: 353 mg/dL — ABNORMAL HIGH (ref 74–109)
OSMOLALITY, CALCULATED: 284 mOsm/kg (ref 270–290)
POTASSIUM: 4.6 mmol/L (ref 3.5–5.1)
PROTEIN TOTAL: 7.5 g/dL (ref 6.4–8.9)
SODIUM: 135 mmol/L — ABNORMAL LOW (ref 136–145)

## 2022-04-28 ENCOUNTER — Other Ambulatory Visit: Payer: Self-pay

## 2022-04-28 ENCOUNTER — Ambulatory Visit
Payer: Medicaid Other | Attending: NURSE PRACTITIONER-PSYCHIATRIC-MENTAL HEALTH | Admitting: NURSE PRACTITIONER-PSYCHIATRIC-MENTAL HEALTH

## 2022-04-28 ENCOUNTER — Encounter (HOSPITAL_PSYCHIATRIC): Payer: Self-pay | Admitting: NURSE PRACTITIONER-PSYCHIATRIC-MENTAL HEALTH

## 2022-04-28 VITALS — BP 151/98 | HR 90 | Resp 18 | Ht 71.0 in | Wt 222.0 lb

## 2022-04-28 DIAGNOSIS — G4700 Insomnia, unspecified: Secondary | ICD-10-CM | POA: Insufficient documentation

## 2022-04-28 DIAGNOSIS — F609 Personality disorder, unspecified: Secondary | ICD-10-CM | POA: Insufficient documentation

## 2022-04-28 DIAGNOSIS — F411 Generalized anxiety disorder: Secondary | ICD-10-CM | POA: Insufficient documentation

## 2022-04-28 DIAGNOSIS — Z79899 Other long term (current) drug therapy: Secondary | ICD-10-CM | POA: Insufficient documentation

## 2022-04-28 DIAGNOSIS — F3163 Bipolar disorder, current episode mixed, severe, without psychotic features: Secondary | ICD-10-CM | POA: Insufficient documentation

## 2022-04-28 MED ORDER — DULOXETINE 30 MG CAPSULE,DELAYED RELEASE
90.0000 mg | DELAYED_RELEASE_CAPSULE | Freq: Every day | ORAL | 0 refills | Status: DC
Start: 2022-04-28 — End: 2022-08-03

## 2022-04-28 MED ORDER — ZOLPIDEM 10 MG TABLET
10.0000 mg | ORAL_TABLET | Freq: Every evening | ORAL | 1 refills | Status: DC
Start: 2022-04-28 — End: 2022-08-03

## 2022-04-28 MED ORDER — ALPRAZOLAM 1 MG TABLET
1.0000 mg | ORAL_TABLET | Freq: Three times a day (TID) | ORAL | 0 refills | Status: DC | PRN
Start: 2022-04-28 — End: 2022-07-06

## 2022-04-28 MED ORDER — LURASIDONE 80 MG TABLET
80.0000 mg | ORAL_TABLET | Freq: Every day | ORAL | 0 refills | Status: DC
Start: 2022-04-28 — End: 2022-08-03

## 2022-04-28 MED ORDER — HYDROXYZINE PAMOATE 25 MG CAPSULE
25.0000 mg | ORAL_CAPSULE | Freq: Every evening | ORAL | 1 refills | Status: DC | PRN
Start: 2022-04-28 — End: 2022-08-03

## 2022-04-28 MED ORDER — DEXTROMETHORPHAN 20 MG-QUINIDINE 10 MG CAPSULE
1.0000 | ORAL_CAPSULE | Freq: Every day | ORAL | 0 refills | Status: DC
Start: 2022-04-28 — End: 2022-08-03

## 2022-04-28 NOTE — Progress Notes (Signed)
[place], [facility] [place]  Operated by [facility]  Progress Note    Name: Karen Gilbert MRN:  [id number]   Date: 04/28/2022 Age: 41 y.o.       Chief Complaint: Bipolar Disorder and Generalized Anxiety (Mood d/o, personality d/o)    Subjective:   Present illness:  Patient here for follow-up on her depression, anxiety, insomnia.  Today the patient states she is "aggravated. "  She has ongoing depression anxiety due to numerous psychosocial stressors.  There has been some ongoing conflict at her current place of employment.  She has numerous physical issues and chronic pain which also contribute to symptoms.  She is feeling overwhelmed due to the holidays.  Her granddaughter is with her again today for the appointment. Overall her mood has been stable.  She has had some increased anxiety recently she states mostly due to the appearance of her teeth.She has chronic pain which contributes to her symptoms as well.   Most of the appointment was spent allowing her time to vent her frustration about her ongoing medical issues and frustration with family.  Offered support and encouragement which easily accepts. No reports of any increase in irritability or mood swings.   She has applied for disability.    Risk benefits, side effect potential of medication reiterated with good understanding verbalized.  We discussed side effect potential of antipsychotics including EPS, NMS, TD and metabolic syndrome reiterated with good understanding verbalized.  Benzodiazepine precautions reiterated with good understanding verbalized.    Refills sent to the pharmacy today.  No reports of panic attacks.     She has a good appetite and has been eating well.    She denies any suicidal or homicidal ideations.  No attempts to self-harm.   She denies any auditory or visual hallucinations, delusions or paranoia.     No other complaints voiced.       PHQ9=14        Board  of Pharmacy:  looks appropriate on 01/26/2022, UDS obtained 12/25/21 and was positive for benzodiazepines as expected no other illicit substances noted.     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 daily  Vistaril 25-50 milligrams p.o. nightly as needed  Ambien 10 mg p.o. 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  They are alert to time place person and situation.  Speech is within normal limits.  Mood is "okay" affect is congruent.  There are no auditory or visual tactile hallucinations.  Thought content is within normal limits there is no reported self-harm thoughts no reported suicidal thoughts.  No aggressive thoughts.  Axis linear logical goal-directed.  Associations within normal limits.  Language  within normal limits.  Recent memory is intact as evidenced by can state the name of provider and reports with a had for the last meal.  Remote memory is intact as evidenced by able to report when married.  Immediate memory is intact as evidenced by provider correct details of the current situation.  Intellectual functioning is good as evidenced by can name the president, vice president and vocabulary is consistent with known level of education.  Insight is fair as evidenced by sees self as having a psychiatric illness; aware of symptoms; and senses  level of illness  affects her functioning.  Judgment is fair as evidenced by has been compliant with treatment.  Objective :  BP (!) 151/98 (Site: Left, Patient Position: Sitting)   Pulse 90   Resp 18   Ht 1.803 m ('5\' 11"'$ )   Wt 101 kg (222 lb)   BMI 30.96 kg/m         Data reviewed:    Current Outpatient Medications   Medication Sig    albuterol sulfate (PROVENTIL OR VENTOLIN OR PROAIR) 90 mcg/actuation Inhalation oral inhaler Take 2 Puffs by inhalation Every 6 hours as needed    ALPRAZolam (XANAX) 1 mg Oral Tablet Take 1 Tablet (1 mg total) by mouth Three times a day as needed for Insomnia (Patient taking differently: Take 1 Tablet (1 mg total) by mouth Three times a day as needed for Anxiety)    amLODIPine (NORVASC) 2.5 mg Oral Tablet Take 1 Tablet (2.5 mg total) by mouth Once a day    ammonium lactate (LAC-HYDRIN) 12 % Lotion Apply 1 Each topically Twice daily    benzocaine (ORAJEL SEVERE) 20 % Mucous Membrane Gel Take 1 Each by mouth Once per day as needed    bethanechol chloride (URECHOLINE) 25 mg Oral Tablet Take 1 Tablet (25 mg total) by mouth Twice daily    budesonide-formoteroL (SYMBICORT) 80-4.5 mcg/actuation Inhalation oral inhaler Take 2 Puffs by inhalation Twice daily    cholecalciferol, vitamin D3, 1,250 mcg (50,000 unit) Oral Capsule Take 1 Capsule (50,000 Units total) by mouth Every 3 days    dextromethorphan-quinidine (NUEDEXTA) 20-10 mg Oral Capsule Take 1 Capsule by mouth Once a day    Diltiazem HCl (TIAZAC) 300 mg Oral Capsule,Sustained Action 24 hr Take 1 Capsule (300 mg total) by mouth Once a day    docusate sodium (COLACE) 100 mg Oral Capsule Take 1 Capsule (100 mg total) by mouth Once a day    DULoxetine (CYMBALTA DR) 30 mg Oral Capsule, Delayed Release(E.C.) Take 3 Capsules (90 mg total) by mouth Once a day    erenumab-aooe (AIMOVIG AUTOINJECTOR) 140 mg/mL Subcutaneous Auto-Injector Inject 1 mL (140 mg total) under the skin Every 30 days    ezetimibe (ZETIA) 10 mg Oral Tablet Take 1 Tablet (10 mg total)  by mouth Every evening    FEROSUL 325 mg (65 mg iron) Oral Tablet Take 1 Tablet (325 mg total) by mouth Once a day    furosemide (LASIX) 20 mg Oral Tablet Take 1 Tablet (20 mg total) by mouth Once per day as needed    gabapentin (NEURONTIN) 600 mg Oral Tablet Take 1 Tablet (600 mg total) by mouth Three times a day Two caps three times daily    hydrochlorothiazide (HYDRODIURIL) 25 mg Oral Tablet Take 1 Tablet (25 mg total) by mouth Once per day as needed    hydrOXYchloroQUINE (PLAQUENIL) 200 mg Oral Tablet Take 1 Tablet (200 mg total) by mouth Once a day  hydrOXYzine pamoate (VISTARIL) 25 mg Oral Capsule Take 1 Capsule (25 mg total) by mouth Every night as needed 25-50 mg QHS as needed.    Ibuprofen (MOTRIN) 800 mg Oral Tablet Take 1 Tablet (800 mg total) by mouth Three times a day as needed    insulin aspart (NOVOLOG FLEXPEN U-100 INSULIN SUBQ) Inject 1 Units under the skin Per instructions (INSULIN PUMP)    LANTUS SOLOSTAR U-100 INSULIN 100 unit/mL (3 mL) Subcutaneous Insulin Pen Inject 55 Units under the skin Every evening    LINZESS 145 mcg Oral Capsule Take 1 Capsule (145 mcg total) by mouth Once a day    lurasidone (LATUDA) 80 mg Oral Tablet Take 1 Tablet (80 mg total) by mouth Once a day Must administer with food (at least 350 calories)    magnesium oxide (MAG-OX) 400 mg Oral Tablet Take 1 Tablet (400 mg total) by mouth Twice daily    Olmesartan-Hydrochlorothiazide 40-12.5 mg Oral Tablet Take 1 Tablet by mouth Once a day    ondansetron (ZOFRAN ODT) 4 mg Oral Tablet, Rapid Dissolve Take 1 Tablet (4 mg total) by mouth Every 8 hours as needed for Nausea/Vomiting    pantoprazole (PROTONIX) 40 mg Oral Tablet, Delayed Release (E.C.) Take 1 Tablet (40 mg total) by mouth Once a day    polyethylene glycol (MIRALAX) 17 gram/dose Oral Powder Take by mouth Once per day as needed (constipation)    rimegepant (NURTEC ODT) 75 mg Oral Tablet, Rapid Dissolve Take 1 Tablet (75 mg total) by mouth Once, as needed (migraine)     rosuvastatin (CRESTOR) 20 mg Oral Tablet Take 1 Tablet (20 mg total) by mouth Every evening    sucralfate (CARAFATE) 1 gram Oral Tablet Take 1 Tablet (1 g total) by mouth Once a day    thyroid (ARMOUR THYROID) 180 mg Oral Tablet Take 1 Tablet (180 mg total) by mouth Once a day    topiramate (TOPAMAX) 50 mg Oral Tablet Take 1.5 Tablets (75 mg total) by mouth Twice daily    ubrogepant (UBRELVY) 50 mg Oral Tablet Take 1 Tablet (50 mg total) by mouth Once, as needed    zolpidem (AMBIEN) 10 mg Oral Tablet Take 1 Tablet (10 mg total) by mouth Every night     Assessment/Plan  Continue Cymbalta  90 mg daily, Nuedexta 20/10 mg daily,  xanax 1 mg 3 times daily, and continue Latuda 80 mg daily.  Vistaril 25-'50mg'$  p.o. nightly as needed for sleep, Ambien 10 mg p.o. nightly sent in to [facility].  Patient given written and verbal instructions regarding good sleep hygiene.  Reportable signs and symptoms, risks, benefits and potential side effects reiterated with good understanding verbalized.  Patient agreeable to following up in 3 months or sooner should there be any new problems or complications.  Pt verbalized understanding, denied questions, agreed to treatment plan.     Continue medications as ordered.  Referred patient to [facility] for counseling due to numerous psychosocial stressors.  Advised patient to contact clinic in case of need and crisis line in case of emergency  Patient is at low risk of self harm and does not warrant inpatient hospitalization at this time and does not present as dangerous to self / others or gravely disabled at this time.  Advised to take all medications as direct and keep all follow-up appointments for primary and specialty medical care.  Advised to notify MD or go to nearest ER for new/worsening symptoms.  Advised to avoid alcohol, tobacco, and  illicit drugs.  After standard discussion of risks, benefits, side effects, indications, therapeutic rationale, and alternatives  (including doing nothing), patient gives informed consent re: medications.  Patient verbalized understanding and agreement with treatment plan.     Moderate level of medical decision making includes review of old records, discussion of diagnosis and symptoms, review of PHQ-9, review of symptoms, patient education, discussion of prescirbed medications and side effects, discussion of psychosocial stressors, and review of prescription monitoring program information. I offered support and encouragement.    Shylin Keizer, PMHNP-BC

## 2022-07-06 ENCOUNTER — Other Ambulatory Visit (HOSPITAL_COMMUNITY): Payer: Self-pay | Admitting: NURSE PRACTITIONER

## 2022-07-06 ENCOUNTER — Ambulatory Visit
Admission: RE | Admit: 2022-07-06 | Discharge: 2022-07-06 | Disposition: A | Discharge status: Home / Self Care | Payer: Medicaid Other | Source: Ambulatory Visit | Attending: NURSE PRACTITIONER | Admitting: NURSE PRACTITIONER

## 2022-07-06 ENCOUNTER — Other Ambulatory Visit: Payer: Self-pay

## 2022-07-06 ENCOUNTER — Other Ambulatory Visit (HOSPITAL_PSYCHIATRIC): Payer: Self-pay | Admitting: NURSE PRACTITIONER-PSYCHIATRIC-MENTAL HEALTH

## 2022-07-06 DIAGNOSIS — M25531 Pain in right wrist: Secondary | ICD-10-CM

## 2022-07-06 DIAGNOSIS — M25521 Pain in right elbow: Secondary | ICD-10-CM

## 2022-07-06 DIAGNOSIS — M25529 Pain in unspecified elbow: Secondary | ICD-10-CM | POA: Insufficient documentation

## 2022-07-06 DIAGNOSIS — M25539 Pain in unspecified wrist: Secondary | ICD-10-CM

## 2022-07-06 MED ORDER — ALPRAZOLAM 1 MG TABLET
1.0000 mg | ORAL_TABLET | Freq: Three times a day (TID) | ORAL | 0 refills | Status: DC | PRN
Start: 2022-07-06 — End: 2022-08-03

## 2022-07-06 NOTE — Telephone Encounter (Signed)
Patient called and said she needs a refill on Xanax. Called pharmacy and spoke with Delsa Sale at [facility] and she said they need a new script. If you don't mind to approve or deny. Thank You

## 2022-07-12 IMAGING — US US THYROID
1 series · 14 of 25 positions shown · non-contrast
Comparison: None available

﻿EXAM:  29299   US THYROID
INDICATION: Chronic thyroid disease.
TECHNIQUE: High-resolution ultrasound of the thyroid with Doppler.

[Series 1: us thyroid · 14 of 47 slices shown]
[im 1/47]
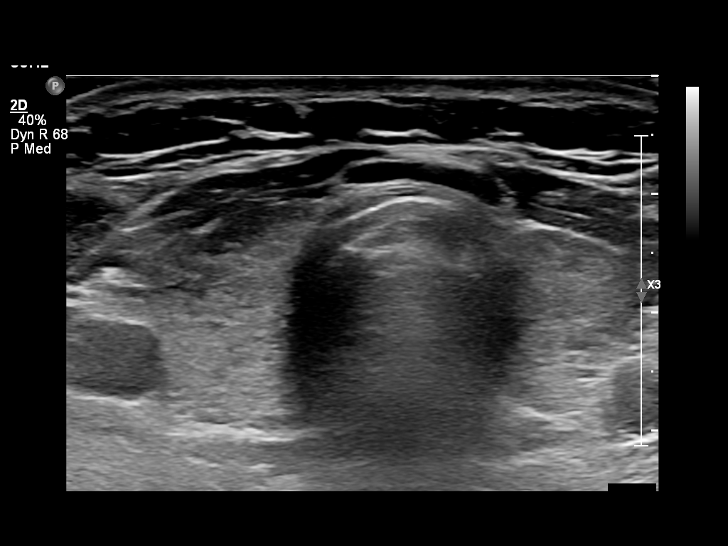
[im 4/47]
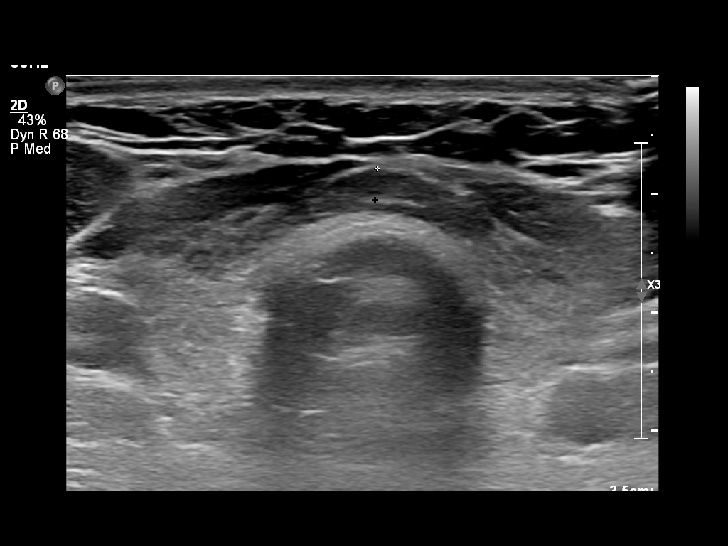
[im 8/47]
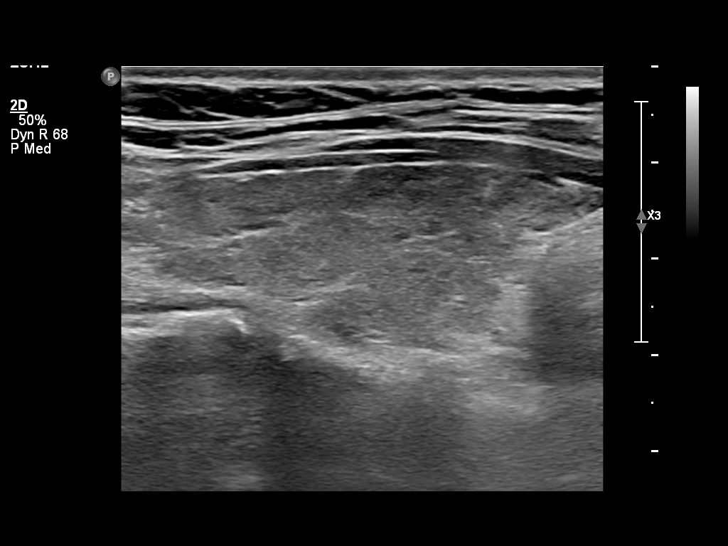
[im 12/47]
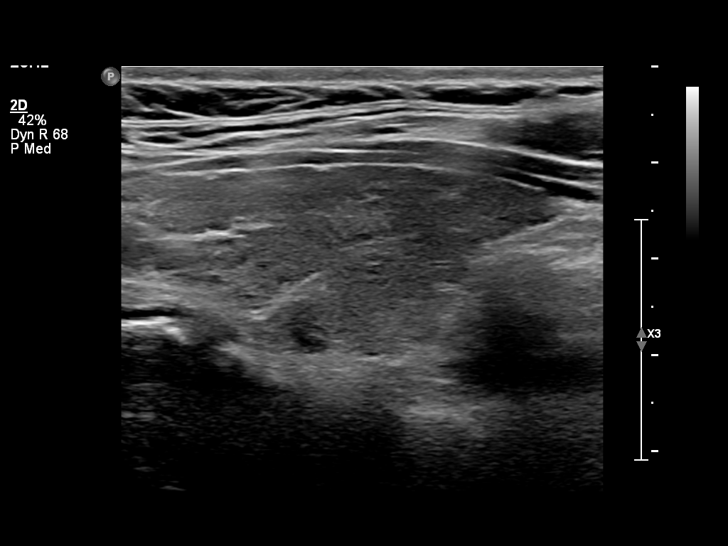
[im 16/47]
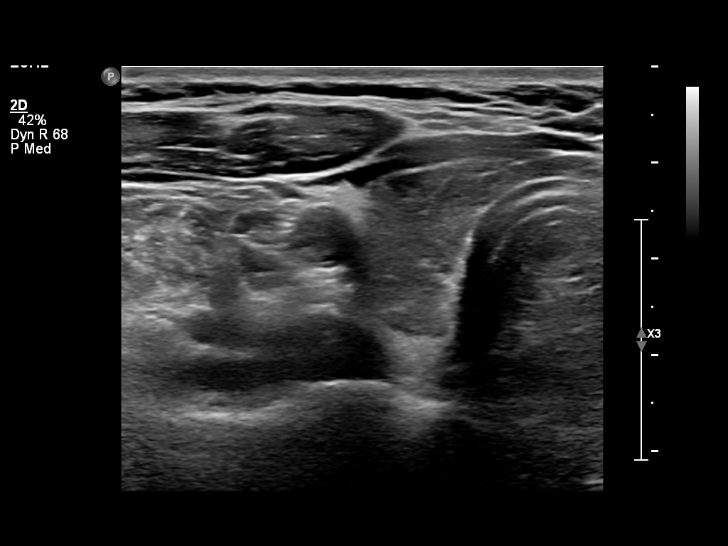
[im 18/47]
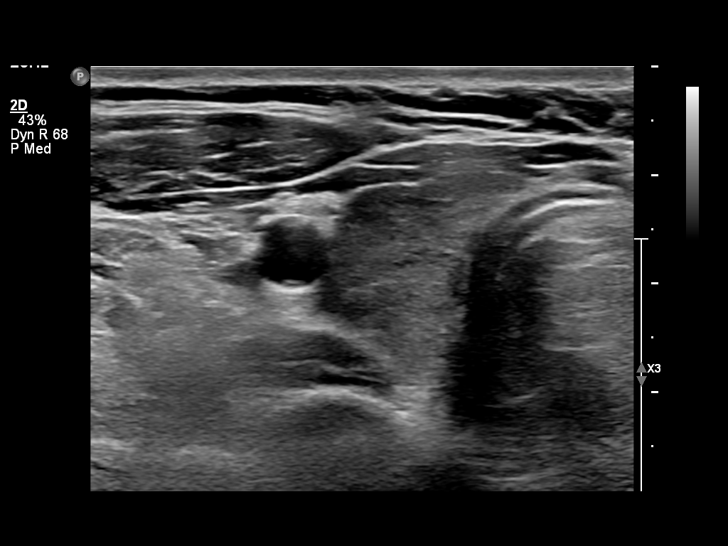
[im 22/47]
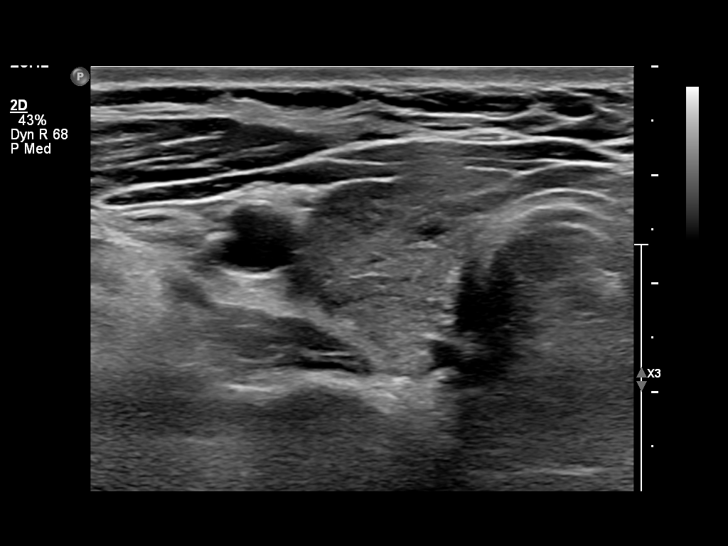
[im 25/47]
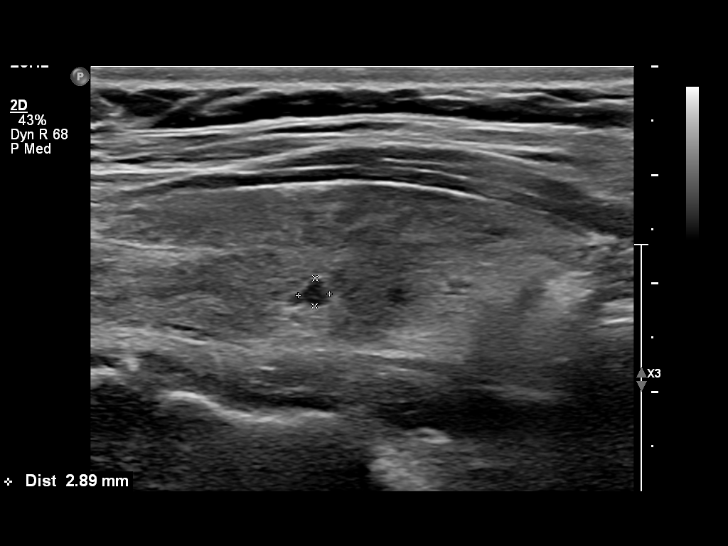
[im 29/47]
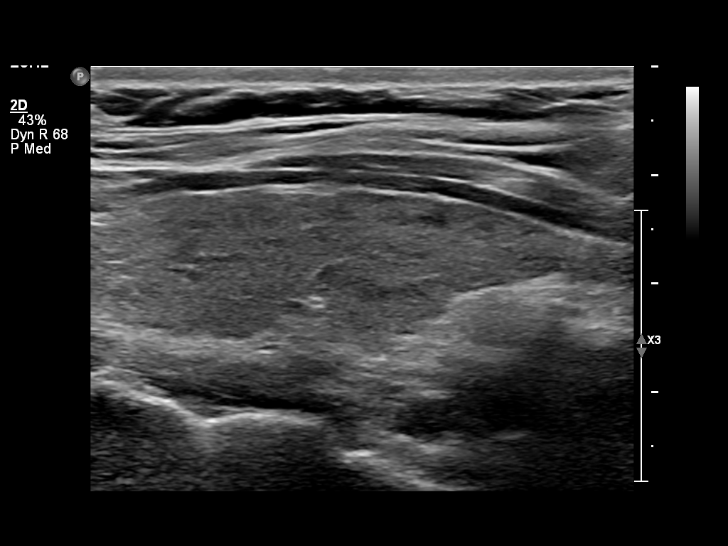
[im 31/47]
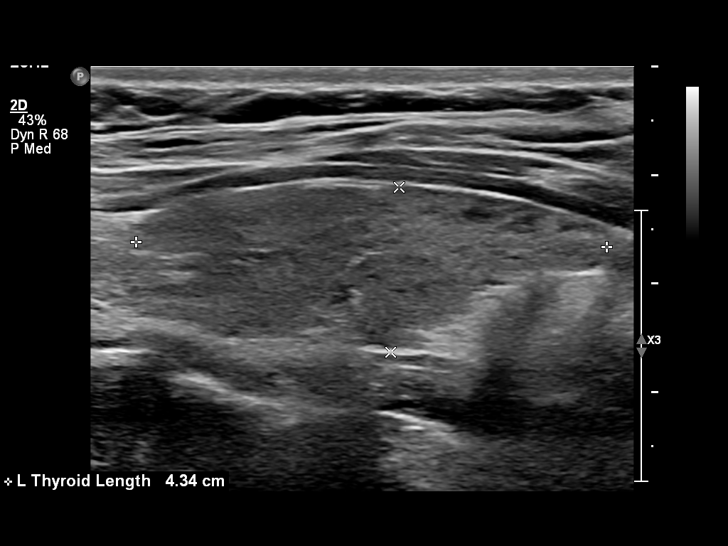
[im 35/47]
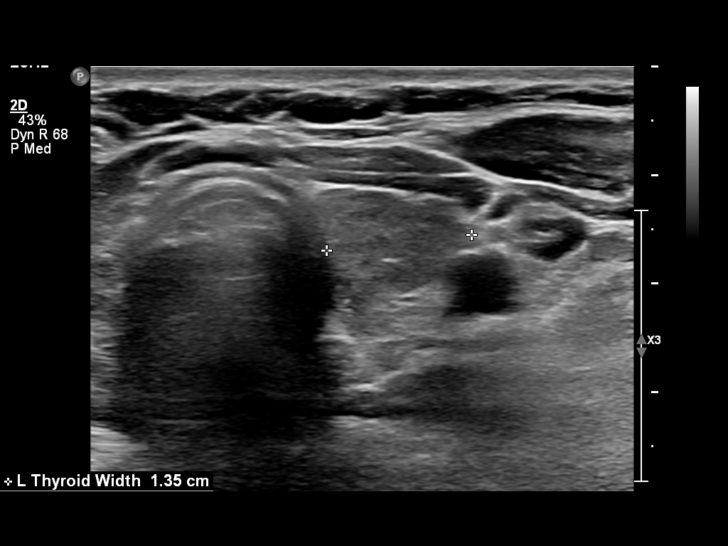
[im 39/47]
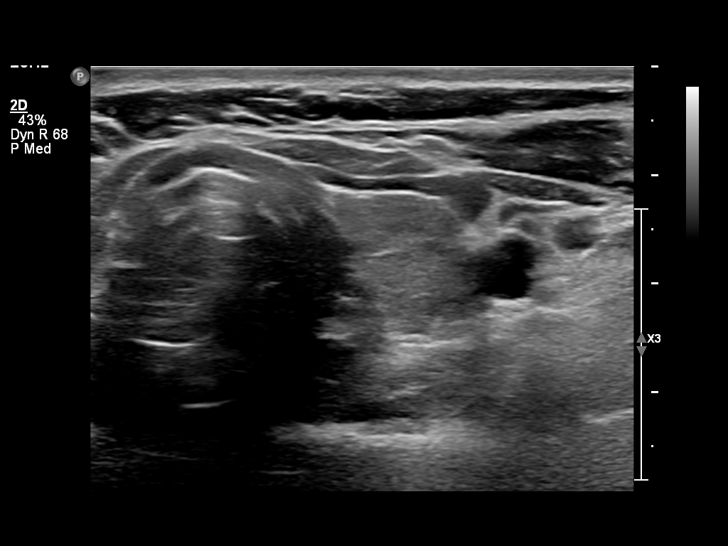
[im 43/47]
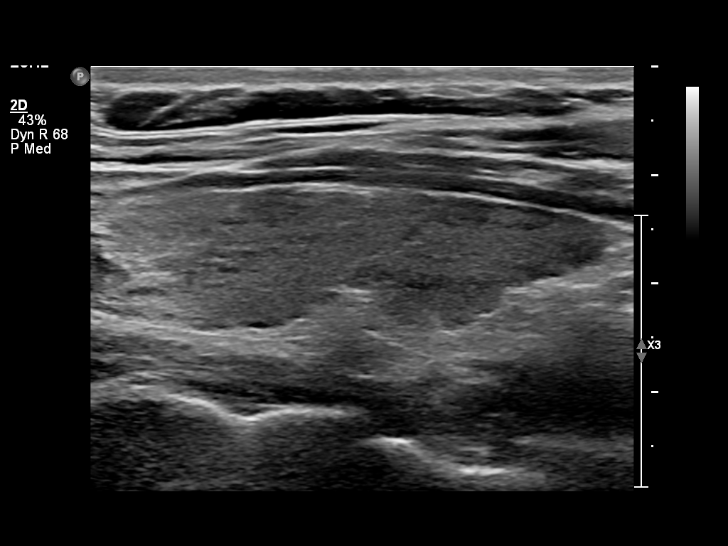
[im 47/47]
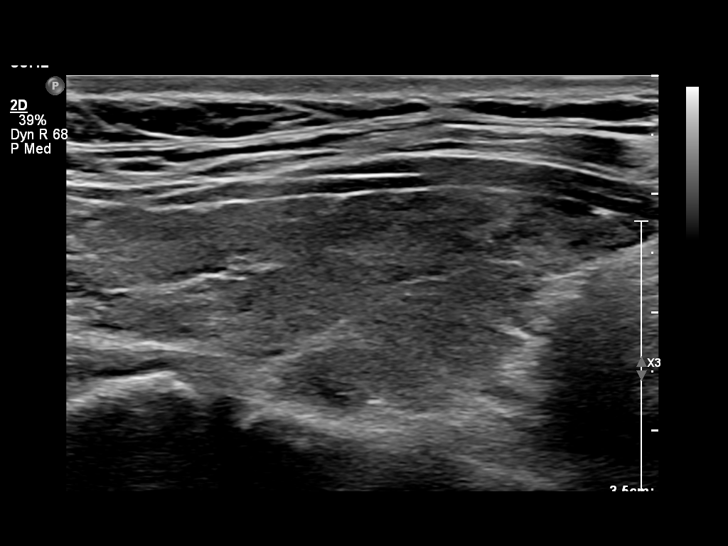

[14 of 25 positions shown; findings below may reference images not displayed]

FINDINGS: Overall size of the thyroid gland is normal at 4.8 x 2 x 1.3 cm on the right and 4.3 x 1.5 x 1.5 cm on the left.  Diffusely altered echotexture of thyroid gland is noted due to chronic thyroid disease. No dominant focal mass or nodule is seen. Tiny 2 mm by 3 mm cyst on the left side.
IMPRESSION: Diffusely altered echotexture of thyroid gland suggestive of chronic thyroid disease. 

 TI-RADS Category 2

TI-RADS Categories:

TI-RADS 1: Normal thyroid gland

TI-RADS 2: Benign conditions (0% risk of malignancy)

TI-RADS 3: Probably benign nodules (<5% malignancy)

TI-RADS 4: Suspicious nodules (5-80% malignancy)

TI-RADS 4a: Undetermined (5-10% malignancy)

TI-RADS 4b: Suspicious (10-80% malignancy)

TI-RADS 5: Probably malignant nodules (>80% malignancy)

TI-RADS 6: Biopsy proven malignancy

## 2022-07-28 ENCOUNTER — Ambulatory Visit (HOSPITAL_PSYCHIATRIC): Payer: Medicaid Other | Admitting: NURSE PRACTITIONER-PSYCHIATRIC-MENTAL HEALTH

## 2022-08-03 ENCOUNTER — Other Ambulatory Visit (HOSPITAL_PSYCHIATRIC): Payer: Self-pay | Admitting: NURSE PRACTITIONER-PSYCHIATRIC-MENTAL HEALTH

## 2022-08-03 MED ORDER — DULOXETINE 30 MG CAPSULE,DELAYED RELEASE
90.0000 mg | DELAYED_RELEASE_CAPSULE | Freq: Every day | ORAL | 0 refills | Status: DC
Start: 2022-08-03 — End: 2022-09-20

## 2022-08-03 MED ORDER — HYDROXYZINE PAMOATE 25 MG CAPSULE
25.0000 mg | ORAL_CAPSULE | Freq: Every evening | ORAL | 1 refills | Status: DC | PRN
Start: 2022-08-03 — End: 2022-09-20

## 2022-08-03 MED ORDER — ALPRAZOLAM 1 MG TABLET
1.0000 mg | ORAL_TABLET | Freq: Three times a day (TID) | ORAL | 0 refills | Status: DC | PRN
Start: 2022-08-03 — End: 2022-09-07

## 2022-08-03 MED ORDER — ZOLPIDEM 10 MG TABLET
10.0000 mg | ORAL_TABLET | Freq: Every evening | ORAL | 1 refills | Status: DC
Start: 2022-08-03 — End: 2022-09-20

## 2022-08-03 MED ORDER — DEXTROMETHORPHAN 20 MG-QUINIDINE 10 MG CAPSULE
1.0000 | ORAL_CAPSULE | Freq: Every day | ORAL | 0 refills | Status: DC
Start: 2022-08-03 — End: 2022-09-20

## 2022-08-03 MED ORDER — LURASIDONE 80 MG TABLET
80.0000 mg | ORAL_TABLET | Freq: Every day | ORAL | 0 refills | Status: DC
Start: 2022-08-03 — End: 2022-09-20

## 2022-08-05 ENCOUNTER — Ambulatory Visit (HOSPITAL_PSYCHIATRIC): Payer: Medicaid Other | Admitting: NURSE PRACTITIONER-PSYCHIATRIC-MENTAL HEALTH

## 2022-08-12 ENCOUNTER — Ambulatory Visit
Payer: Medicaid Other | Attending: NURSE PRACTITIONER-PSYCHIATRIC-MENTAL HEALTH | Admitting: NURSE PRACTITIONER-PSYCHIATRIC-MENTAL HEALTH

## 2022-08-12 ENCOUNTER — Other Ambulatory Visit: Payer: Self-pay

## 2022-08-12 ENCOUNTER — Encounter (HOSPITAL_PSYCHIATRIC): Payer: Self-pay | Admitting: NURSE PRACTITIONER-PSYCHIATRIC-MENTAL HEALTH

## 2022-08-12 VITALS — BP 155/96 | HR 92 | Resp 18 | Ht 71.0 in | Wt 228.0 lb

## 2022-08-12 DIAGNOSIS — F609 Personality disorder, unspecified: Secondary | ICD-10-CM | POA: Insufficient documentation

## 2022-08-12 DIAGNOSIS — F411 Generalized anxiety disorder: Secondary | ICD-10-CM | POA: Insufficient documentation

## 2022-08-12 DIAGNOSIS — G4700 Insomnia, unspecified: Secondary | ICD-10-CM | POA: Insufficient documentation

## 2022-08-12 DIAGNOSIS — F3163 Bipolar disorder, current episode mixed, severe, without psychotic features: Secondary | ICD-10-CM | POA: Insufficient documentation

## 2022-08-12 NOTE — Progress Notes (Signed)
[place], [facility] [place]  Operated by [facility]  Progress Note    Name: Kailani Entwistle MRN:  [id number]   Date: 08/12/2022 Age: 42 y.o.       Chief Complaint: Bipolar Disorder and Generalized Anxiety (Personality d/o, mood d/o)    Subjective:   Present illness:  Patient here for follow-up on her depression, anxiety, insomnia.  Today the patient states she is "aggravated. "  She has ongoing depression anxiety due to numerous psychosocial stressors.  She is having some financial issues and has had some trouble getting her disability approved.  Most of the appointment was spent allowing her time to vent frustrations about finances and ongoing medical issues.  She is intermittently teary during the evaluation.    She has numerous physical issues and chronic pain which also contribute to symptoms.  She frequently feels overwhelmed.  Her granddaughter is with her again today for the appointment. Overall her mood has been stable.  She has had some fleeting suicidal thoughts but adamantly denies any plan or intent.  No self-harm behavior.  He struggles with poor self-esteem.  She has chronic pain which contributes to her symptoms as well.   Offered support and encouragement which easily accepts. No reports of any increase in irritability or mood swings.      Risk benefits, side effect potential of medication reiterated with good understanding verbalized.  We discussed side effect potential of antipsychotics including EPS, NMS, TD and metabolic syndrome reiterated with good understanding verbalized.  Benzodiazepine precautions reiterated with good understanding verbalized.    Refills sent in earlier this month.  No reports of panic attacks.     She has a good appetite and has been eating well.    She denies any suicidal or homicidal ideations.  No attempts to self-harm.   She denies any auditory or visual hallucinations, delusions or paranoia.      No other complaints voiced.       PHQ9=21        Board of Pharmacy:  Unable to review on 08/12/2022, UDS obtained 12/25/21 and was positive for benzodiazepines as expected no other illicit substances noted.     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 daily  Vistaril 25-50 milligrams p.o. nightly as needed  Ambien 10 mg p.o. 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  They are alert to time place person and situation.  Speech is within normal limits.  Mood is "okay" affect is congruent.  There are no auditory or visual tactile hallucinations.  Thought content is within normal limits there is no reported self-harm thoughts no reported suicidal thoughts.  No aggressive thoughts.  Axis  linear logical goal-directed.  Associations within normal limits.  Language within normal limits.  Recent memory is intact as evidenced by can state the name of provider and reports with a had for the last meal.  Remote memory is intact as evidenced by able to report when married.  Immediate memory is intact as evidenced by provider correct details of the current situation.  Intellectual functioning is good as evidenced by can name the president, vice president and vocabulary is consistent with known level of education.  Insight is fair as evidenced by sees self as having a psychiatric  illness; aware of symptoms; and senses  level of illness affects her functioning.  Judgment is fair as evidenced by has been compliant with treatment.  Objective :  BP (!) 155/96 (Site: Left, Patient Position: Sitting)   Pulse 92   Resp 18   Ht 1.803 m (5\' 11" )   Wt 103 kg (228 lb)   BMI 31.80 kg/m         Data reviewed:    Current Outpatient Medications   Medication Sig    albuterol sulfate (PROVENTIL OR VENTOLIN OR PROAIR) 90 mcg/actuation Inhalation oral inhaler Take 2 Puffs by inhalation Every 6 hours as needed    ALPRAZolam (XANAX) 1 mg Oral Tablet Take 1 Tablet (1 mg total) by mouth Three times a day as needed for Anxiety    amLODIPine (NORVASC) 2.5 mg Oral Tablet Take 1 Tablet (2.5 mg total) by mouth Once a day    ammonium lactate (LAC-HYDRIN) 12 % Lotion Apply 1 Each topically Twice daily    benzocaine (ORAJEL SEVERE) 20 % Mucous Membrane Gel Take 1 Each by mouth Once per day as needed    bethanechol chloride (URECHOLINE) 25 mg Oral Tablet Take 1 Tablet (25 mg total) by mouth Twice daily    budesonide-formoteroL (SYMBICORT) 80-4.5 mcg/actuation Inhalation oral inhaler Take 2 Puffs by inhalation Twice daily    cholecalciferol, vitamin D3, 1,250 mcg (50,000 unit) Oral Capsule Take 1 Capsule (50,000 Units total) by mouth Every 3 days    dextromethorphan-quinidine (NUEDEXTA) 20-10 mg Oral Capsule Take 1 Capsule by mouth Once a day    Diltiazem HCl (TIAZAC) 300 mg Oral Capsule,Sustained Action 24 hr Take 1 Capsule (300 mg total) by mouth Once a day    docusate sodium (COLACE) 100 mg Oral Capsule Take 1 Capsule (100 mg total) by mouth Once a day    DULoxetine (CYMBALTA DR) 30 mg Oral Capsule, Delayed Release(E.C.) Take 3 Capsules (90 mg total) by mouth Once a day    erenumab-aooe (AIMOVIG AUTOINJECTOR) 140 mg/mL Subcutaneous Auto-Injector Inject 1 mL (140 mg total) under the skin Every 30 days    ezetimibe (ZETIA) 10 mg Oral Tablet Take 1 Tablet (10 mg total) by mouth Every evening    FEROSUL 325 mg (65 mg  iron) Oral Tablet Take 1 Tablet (325 mg total) by mouth Once a day    furosemide (LASIX) 20 mg Oral Tablet Take 1 Tablet (20 mg total) by mouth Once per day as needed    gabapentin (NEURONTIN) 600 mg Oral Tablet Take 1 Tablet (600 mg total) by mouth Three times a day Two caps three times daily    hydrochlorothiazide (HYDRODIURIL) 25 mg Oral Tablet Take 1 Tablet (25 mg total) by mouth Once per day as needed    hydrOXYchloroQUINE (PLAQUENIL) 200 mg Oral Tablet Take 1 Tablet (200 mg total) by mouth Once a day    hydrOXYzine pamoate (VISTARIL) 25 mg Oral Capsule  Take 1 Capsule (25 mg total) by mouth Every night as needed 25-50 mg QHS as needed.    Ibuprofen (MOTRIN) 800 mg Oral Tablet Take 1 Tablet (800 mg total) by mouth Three times a day as needed    insulin aspart (NOVOLOG FLEXPEN U-100 INSULIN SUBQ) Inject 1 Units under the skin Per instructions (INSULIN PUMP)    LANTUS SOLOSTAR U-100 INSULIN 100 unit/mL (3 mL) Subcutaneous Insulin Pen Inject 55 Units under the skin Every evening    LINZESS 145 mcg Oral Capsule Take 1 Capsule (145 mcg total) by mouth Once a day    lurasidone (LATUDA) 80 mg Oral Tablet Take 1 Tablet (80 mg total) by mouth Once a day Must administer with food (at least 350 calories)    magnesium oxide (MAG-OX) 400 mg Oral Tablet Take 1 Tablet (400 mg total) by mouth Twice daily    Olmesartan-Hydrochlorothiazide 40-12.5 mg Oral Tablet Take 1 Tablet by mouth Once a day    ondansetron (ZOFRAN ODT) 4 mg Oral Tablet, Rapid Dissolve Take 1 Tablet (4 mg total) by mouth Every 8 hours as needed for Nausea/Vomiting    pantoprazole (PROTONIX) 40 mg Oral Tablet, Delayed Release (E.C.) Take 1 Tablet (40 mg total) by mouth Once a day    polyethylene glycol (MIRALAX) 17 gram/dose Oral Powder Take by mouth Once per day as needed (constipation)    rimegepant (NURTEC ODT) 75 mg Oral Tablet, Rapid Dissolve Take 1 Tablet (75 mg total) by mouth Once, as needed (migraine)    rosuvastatin (CRESTOR) 20 mg Oral Tablet Take 1  Tablet (20 mg total) by mouth Every evening    sucralfate (CARAFATE) 1 gram Oral Tablet Take 1 Tablet (1 g total) by mouth Once a day    thyroid (ARMOUR THYROID) 180 mg Oral Tablet Take 1 Tablet (180 mg total) by mouth Once a day    topiramate (TOPAMAX) 50 mg Oral Tablet Take 1.5 Tablets (75 mg total) by mouth Twice daily    ubrogepant (UBRELVY) 50 mg Oral Tablet Take 1 Tablet (50 mg total) by mouth Once, as needed    zolpidem (AMBIEN) 10 mg Oral Tablet Take 1 Tablet (10 mg total) by mouth Every night     Assessment/Plan  Continue Cymbalta  90 mg daily, Nuedexta 20/10 mg daily,  xanax 1 mg 3 times daily, and continue Latuda 80 mg daily.  Vistaril 25-50mg  p.o. nightly as needed for sleep, Ambien 10 mg p.o. nightly sent in to [facility].  Patient given written and verbal instructions regarding good sleep hygiene.  Reportable signs and symptoms, risks, benefits and potential side effects reiterated with good understanding verbalized.  Patient agreeable to following up in 3 months or sooner should there be any new problems or complications.  Pt verbalized understanding, denied questions, agreed to treatment plan.     Continue medications as ordered.  Referred patient to [facility] for counseling due to numerous psychosocial stressors.  Advised patient to contact clinic in case of need and crisis line in case of emergency  Patient is at low risk of self harm and does not warrant inpatient hospitalization at this time and does not present as dangerous to self / others or gravely disabled at this time.  Advised to take all medications as direct and keep all follow-up appointments for primary and specialty medical care.  Advised to notify MD or go to nearest ER for new/worsening symptoms.  Advised to avoid alcohol, tobacco, and illicit drugs.  After standard discussion of  risks, benefits, side effects, indications, therapeutic rationale, and alternatives (including doing nothing), patient gives informed  consent re: medications.  Patient verbalized understanding and agreement with treatment plan.     Moderate level of medical decision making includes review of old records, discussion of diagnosis and symptoms, review of PHQ-9, review of symptoms, patient education, discussion of prescirbed medications and side effects, discussion of psychosocial stressors, and review of prescription monitoring program information. I offered support and encouragement.    Caylah Plouff, PMHNP-BC
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
## 2022-09-06 ENCOUNTER — Other Ambulatory Visit (HOSPITAL_PSYCHIATRIC): Payer: Self-pay | Admitting: NURSE PRACTITIONER-PSYCHIATRIC-MENTAL HEALTH

## 2022-09-07 NOTE — Telephone Encounter (Signed)
I f you don't mind to approve or deny. Thank You

## 2022-09-20 ENCOUNTER — Ambulatory Visit
Payer: Medicaid Other | Attending: NURSE PRACTITIONER-PSYCHIATRIC-MENTAL HEALTH | Admitting: NURSE PRACTITIONER-PSYCHIATRIC-MENTAL HEALTH

## 2022-09-20 ENCOUNTER — Other Ambulatory Visit: Payer: Self-pay

## 2022-09-20 ENCOUNTER — Encounter (HOSPITAL_PSYCHIATRIC): Payer: Self-pay | Admitting: NURSE PRACTITIONER-PSYCHIATRIC-MENTAL HEALTH

## 2022-09-20 VITALS — BP 126/88 | HR 97 | Resp 18 | Ht 70.0 in | Wt 232.0 lb

## 2022-09-20 DIAGNOSIS — F3163 Bipolar disorder, current episode mixed, severe, without psychotic features: Secondary | ICD-10-CM | POA: Insufficient documentation

## 2022-09-20 DIAGNOSIS — F411 Generalized anxiety disorder: Secondary | ICD-10-CM | POA: Insufficient documentation

## 2022-09-20 DIAGNOSIS — F609 Personality disorder, unspecified: Secondary | ICD-10-CM | POA: Insufficient documentation

## 2022-09-20 DIAGNOSIS — Z5181 Encounter for therapeutic drug level monitoring: Secondary | ICD-10-CM | POA: Insufficient documentation

## 2022-09-20 DIAGNOSIS — G4700 Insomnia, unspecified: Secondary | ICD-10-CM | POA: Insufficient documentation

## 2022-09-20 MED ORDER — DULOXETINE 30 MG CAPSULE,DELAYED RELEASE
90.0000 mg | DELAYED_RELEASE_CAPSULE | Freq: Every day | ORAL | 0 refills | Status: DC
Start: 2022-09-20 — End: 2022-11-22

## 2022-09-20 MED ORDER — LURASIDONE 80 MG TABLET
80.0000 mg | ORAL_TABLET | Freq: Every day | ORAL | 0 refills | Status: DC
Start: 2022-09-20 — End: 2022-11-22

## 2022-09-20 MED ORDER — ZOLPIDEM 10 MG TABLET
10.0000 mg | ORAL_TABLET | Freq: Every evening | ORAL | 1 refills | Status: DC
Start: 2022-09-20 — End: 2022-11-22

## 2022-09-20 MED ORDER — DEXTROMETHORPHAN 20 MG-QUINIDINE 10 MG CAPSULE
1.0000 | ORAL_CAPSULE | Freq: Every day | ORAL | 0 refills | Status: DC
Start: 2022-09-20 — End: 2022-11-22

## 2022-09-20 MED ORDER — HYDROXYZINE PAMOATE 25 MG CAPSULE
25.0000 mg | ORAL_CAPSULE | Freq: Every evening | ORAL | 1 refills | Status: DC | PRN
Start: 2022-09-20 — End: 2022-11-22

## 2022-09-20 MED ORDER — ALPRAZOLAM 1 MG TABLET
1.0000 mg | ORAL_TABLET | Freq: Three times a day (TID) | ORAL | 1 refills | Status: DC | PRN
Start: 2022-09-20 — End: 2022-11-22

## 2022-09-20 NOTE — Progress Notes (Signed)
[facility]  BEHAVIORAL MEDICINE, [facility] [place]  Operated by [facility]  Progress Note    Name: Karen Gilbert MRN:  [id number]   Date: 09/20/2022 Age: 42 y.o.       Chief Complaint: Major Depression, Generalized Anxiety, and Bipolar Disorder    Subjective:   Present illness:  Patient here for follow-up on her depression, anxiety, insomnia.  Today the patient states she is "doing okay. "  She has ongoing depression anxiety due to numerous psychosocial stressors.  She is has some financial issues and is still waiting on getting her disability approved.  She has numerous chronic medical conditions and chronic pain.  She has been diagnosed with Sjogren's and RA.  Most of the appointment was spent allowing her time to vent frustrations about finances and ongoing medical issues.  She is in bright spirits today.  She is pleasant and talkative.  She states that things have been going well overall since her last appointment.  She does not want to make any changes to medications.    She frequently feels overwhelmed.  Her granddaughter is with her again today for the appointment. Overall her mood has been stable.  No reports of any suicidal ideation.  No self-harm behavior.  He struggles with poor self-esteem.  She has chronic pain which contributes to her symptoms as well.   Offered support and encouragement which easily accepts. No reports of any increase in irritability or mood swings.      Risk benefits, side effect potential of medication reiterated with good understanding verbalized.  We discussed side effect potential of antipsychotics including EPS, NMS, TD and metabolic syndrome reiterated with good understanding verbalized.  Benzodiazepine precautions reiterated with good understanding verbalized.    Refill sent today.  She would like to follow-up in 2 months or sooner for any new problems or complications.  No reports of panic attacks.     She has a good  appetite and has been eating well.    She denies any suicidal or homicidal ideations.  No attempts to self-harm.   She denies any auditory or visual hallucinations, delusions or paranoia.     No other complaints voiced.       PHQ9=13        Board of Pharmacy:  Unable to review on 09/20/2022, UDS obtained 12/25/21 and was positive for benzodiazepines as expected no other illicit substances noted.     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 daily  Vistaril 25-50 milligrams p.o. nightly as needed  Ambien 10 mg p.o. 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  They are alert to time place person and situation.  Speech is within normal  limits.  Mood is "okay" affect is congruent.  There are no auditory or visual tactile hallucinations.  Thought content is within normal limits there is no reported self-harm thoughts no reported suicidal thoughts.  No aggressive thoughts.  Axis linear logical goal-directed.  Associations within normal limits.  Language within normal limits.  Recent memory is intact as evidenced by can state the name of provider and reports with a had for the last meal.  Remote memory is intact as evidenced by able to report when married.  Immediate memory is intact as evidenced by provider correct details of the current situation.  Intellectual functioning  is good as evidenced by can name the president, vice president and vocabulary is consistent with known level of education.  Insight is fair as evidenced by sees self as having a psychiatric illness; aware of symptoms; and senses  level of illness affects her functioning.  Judgment is fair as evidenced by has been compliant with treatment.  Objective :  BP 126/88 (Site: Left, Patient Position: Sitting)   Pulse 97   Resp 18   Ht 1.778 m (5\' 10" )   Wt 105 kg (232 lb)   BMI 33.29 kg/m         Data reviewed:    Current Outpatient Medications   Medication Sig    albuterol sulfate (PROVENTIL OR VENTOLIN OR PROAIR) 90 mcg/actuation Inhalation oral inhaler Take 2 Puffs by inhalation Every 6 hours as needed    ALPRAZolam (XANAX) 1 mg Oral Tablet TAKE ONE TABLET BY MOUTH THREE TIMES DAILY AS NEEDED FOR ANXIETY    amLODIPine (NORVASC) 2.5 mg Oral Tablet Take 1 Tablet (2.5 mg total) by mouth Once a day    ammonium lactate (LAC-HYDRIN) 12 % Lotion Apply 1 Each topically Twice daily    benzocaine (ORAJEL SEVERE) 20 % Mucous Membrane Gel Take 1 Each by mouth Once per day as needed    bethanechol chloride (URECHOLINE) 25 mg Oral Tablet Take 1 Tablet (25 mg total) by mouth Twice daily    budesonide-formoteroL (SYMBICORT) 80-4.5 mcg/actuation Inhalation oral inhaler Take 2 Puffs by inhalation Twice daily    cholecalciferol, vitamin D3, 1,250 mcg (50,000 unit) Oral Capsule Take 1 Capsule (50,000 Units total) by mouth Every 3 days    dextromethorphan-quinidine (NUEDEXTA) 20-10 mg Oral Capsule Take 1 Capsule by mouth Once a day    Diltiazem HCl (TIAZAC) 300 mg Oral Capsule,Sustained Action 24 hr Take 1 Capsule (300 mg total) by mouth Once a day    docusate sodium (COLACE) 100 mg Oral Capsule Take 1 Capsule (100 mg total) by mouth Once a day    DULoxetine (CYMBALTA DR) 30 mg Oral Capsule, Delayed Release(E.C.) Take 3 Capsules (90 mg total) by mouth Once a day    erenumab-aooe (AIMOVIG AUTOINJECTOR) 140 mg/mL Subcutaneous  Auto-Injector Inject 1 mL (140 mg total) under the skin Every 30 days    ezetimibe (ZETIA) 10 mg Oral Tablet Take 1 Tablet (10 mg total) by mouth Every evening    famotidine (PEPCID) 40 mg Oral Tablet Take 1 Tablet (40 mg total) by mouth Every night    FEROSUL 325 mg (65 mg iron) Oral Tablet Take 1 Tablet (325 mg total) by mouth Once a day    furosemide (LASIX) 20 mg Oral Tablet Take 1 Tablet (20 mg total) by mouth Once per day as needed    gabapentin (NEURONTIN) 600 mg Oral Tablet Take 1 Tablet (600 mg total) by mouth Three times a day Two caps three  times daily    HUMALOG KWIKPEN INSULIN 100 unit/mL Subcutaneous Insulin Pen Inject 80 Units under the skin Twice daily with food    hydrochlorothiazide (HYDRODIURIL) 25 mg Oral Tablet Take 1 Tablet (25 mg total) by mouth Once per day as needed    hydrOXYchloroQUINE (PLAQUENIL) 200 mg Oral Tablet Take 1 Tablet (200 mg total) by mouth Twice daily    hydrOXYzine pamoate (VISTARIL) 25 mg Oral Capsule Take 1 Capsule (25 mg total) by mouth Every night as needed 25-50 mg QHS as needed.    Ibuprofen (MOTRIN) 800 mg Oral Tablet Take 1 Tablet (800 mg total) by mouth Three times a day as needed    insulin aspart (NOVOLOG FLEXPEN U-100 INSULIN SUBQ) Inject 1 Units under the skin Per instructions (INSULIN PUMP) (Patient not taking: Reported on 09/20/2022)    insulin degludec (TRESIBA FLEXTOUCH U-100) 100 unit/mL (3 mL) Subcutaneous Insulin Pen Inject 1 Each under the skin Twice daily Sliding scale    LANTUS SOLOSTAR U-100 INSULIN 100 unit/mL (3 mL) Subcutaneous Insulin Pen Inject 55 Units under the skin Every evening (Patient not taking: Reported on 09/20/2022)    LINZESS 145 mcg Oral Capsule Take 1 Capsule (145 mcg total) by mouth Once a day    lurasidone (LATUDA) 80 mg Oral Tablet Take 1 Tablet (80 mg total) by mouth Once a day Must administer with food (at least 350 calories)    magnesium oxide (MAG-OX) 400 mg Oral Tablet Take 1 Tablet (400 mg total) by mouth Twice daily     Olmesartan-Hydrochlorothiazide 40-12.5 mg Oral Tablet Take 1 Tablet by mouth Once a day    ondansetron (ZOFRAN ODT) 4 mg Oral Tablet, Rapid Dissolve Take 1 Tablet (4 mg total) by mouth Every 8 hours as needed for Nausea/Vomiting    pantoprazole (PROTONIX) 40 mg Oral Tablet, Delayed Release (E.C.) Take 1 Tablet (40 mg total) by mouth Once a day    polyethylene glycol (MIRALAX) 17 gram/dose Oral Powder Take by mouth Once per day as needed (constipation)    rimegepant (NURTEC ODT) 75 mg Oral Tablet, Rapid Dissolve Take 1 Tablet (75 mg total) by mouth Once, as needed (migraine)    rosuvastatin (CRESTOR) 20 mg Oral Tablet Take 1 Tablet (20 mg total) by mouth Every evening    sucralfate (CARAFATE) 1 gram Oral Tablet Take 1 Tablet (1 g total) by mouth Once a day (Patient not taking: Reported on 08/12/2022)    thyroid (ARMOUR THYROID) 180 mg Oral Tablet Take 1 Tablet (180 mg total) by mouth Once a day    topiramate (TOPAMAX) 50 mg Oral Tablet Take 1.5 Tablets (75 mg total) by mouth Twice daily    ubrogepant (UBRELVY) 50 mg Oral Tablet Take 1 Tablet (50 mg total) by mouth Once, as needed (Patient not taking: Reported on 08/12/2022)    zolpidem (AMBIEN) 10 mg Oral Tablet Take 1 Tablet (10 mg total) by mouth Every night     Assessment/Plan  Continue Cymbalta  90 mg daily, Nuedexta 20/10 mg daily,  xanax 1 mg 3 times daily, and continue Latuda 80 mg daily.  Vistaril 25-50mg  p.o. nightly as needed for sleep, Ambien 10 mg p.o. nightly sent in to [facility].  Patient given written and verbal instructions regarding good sleep hygiene.  Reportable signs and symptoms, risks, benefits and potential side effects reiterated with good understanding verbalized.  Patient agreeable to following up in 2 months or sooner should there be any new problems or complications.  Pt verbalized understanding, denied  questions, agreed to treatment plan.     Continue medications as ordered.  Referred patient to [facility] for counseling  due to numerous psychosocial stressors.  Advised patient to contact clinic in case of need and crisis line in case of emergency  Patient is at low risk of self harm and does not warrant inpatient hospitalization at this time and does not present as dangerous to self / others or gravely disabled at this time.  Advised to take all medications as direct and keep all follow-up appointments for primary and specialty medical care.  Advised to notify MD or go to nearest ER for new/worsening symptoms.  Advised to avoid alcohol, tobacco, and illicit drugs.  After standard discussion of risks, benefits, side effects, indications, therapeutic rationale, and alternatives (including doing nothing), patient gives informed consent re: medications.  Patient verbalized understanding and agreement with treatment plan.     Moderate level of medical decision making includes review of old records, discussion of diagnosis and symptoms, review of PHQ-9, review of symptoms, patient education, discussion of prescirbed medications and side effects, discussion of psychosocial stressors, and review of prescription monitoring program information. I offered support and encouragement.    Lise Pincus, PMHNP-BC

## 2022-11-04 ENCOUNTER — Emergency Department
Admission: EM | Admit: 2022-11-04 | Discharge: 2022-11-04 | Disposition: A | Discharge status: Home / Self Care | Payer: Medicaid Other | Source: Ambulatory Visit | Attending: Emergency Medicine | Admitting: Emergency Medicine

## 2022-11-04 ENCOUNTER — Other Ambulatory Visit: Payer: Self-pay

## 2022-11-04 DIAGNOSIS — Z794 Long term (current) use of insulin: Secondary | ICD-10-CM

## 2022-11-04 DIAGNOSIS — B351 Tinea unguium: Secondary | ICD-10-CM | POA: Insufficient documentation

## 2022-11-04 DIAGNOSIS — E1165 Type 2 diabetes mellitus with hyperglycemia: Secondary | ICD-10-CM | POA: Insufficient documentation

## 2022-11-04 DIAGNOSIS — I252 Old myocardial infarction: Secondary | ICD-10-CM | POA: Insufficient documentation

## 2022-11-04 LAB — URINALYSIS, MACRO/MICRO
BILIRUBIN: NEGATIVE mg/dL
BLOOD: NEGATIVE mg/dL
GLUCOSE: >=1000 mg/dL — AB
KETONES: NEGATIVE mg/dL
LEUKOCYTES: NEGATIVE WBCs/uL
NITRITE: NEGATIVE
PH: 6 (ref 4.6–8.0)
PROTEIN: NEGATIVE mg/dL
SPECIFIC GRAVITY: 1.02 (ref 1.003–1.035)
UROBILINOGEN: 0.2 mg/dL (ref 0.2–1.0)

## 2022-11-04 LAB — VENOUS BLOOD GAS/LACTATE
%FIO2 (VENOUS): 21 %
BASE EXCESS: 2.3 mmol/L — ABNORMAL HIGH (ref 0.0–2.0)
BICARBONATE (VENOUS): 28.1 mmol/L — ABNORMAL HIGH (ref 22.0–26.0)
CARBOXYHEMOGLOBIN: 1 % (ref <=1.5)
LACTATE: 1.1 mmol/L (ref <=2.0)
MET-HEMOGLOBIN: 0.5 % (ref <=2.0)
OXYHEMOGLOBIN: 22.4 %
PCO2 (VENOUS): 47 mm/Hg (ref 41–51)
PH (VENOUS): 7.38 (ref 7.31–7.41)
PO2 (VENOUS): <20 mm/Hg — CL (ref 35–50)

## 2022-11-04 LAB — COMPREHENSIVE METABOLIC PANEL, NON-FASTING
ALBUMIN/GLOBULIN RATIO: 0.9 (ref 0.8–1.4)
ALBUMIN: 3.5 g/dL (ref 3.4–5.0)
ALKALINE PHOSPHATASE: 79 U/L (ref 46–116)
ALT (SGPT): 24 U/L (ref <=78)
ANION GAP: 9 mmol/L (ref 4–13)
AST (SGOT): 13 U/L — ABNORMAL LOW (ref 15–37)
BILIRUBIN TOTAL: 0.2 mg/dL (ref 0.2–1.0)
BUN/CREA RATIO: 14
BUN: 10 mg/dL (ref 7–18)
CALCIUM, CORRECTED: 8.8 mg/dL
CALCIUM: 8.4 mg/dL — ABNORMAL LOW (ref 8.5–10.1)
CHLORIDE: 103 mmol/L (ref 98–107)
CO2 TOTAL: 26 mmol/L (ref 21–32)
CREATININE: 0.7 mg/dL (ref 0.55–1.02)
ESTIMATED GFR: 109 mL/min/{1.73_m2} (ref >59)
GLOBULIN: 3.9
GLUCOSE: 275 mg/dL — ABNORMAL HIGH (ref 74–106)
OSMOLALITY, CALCULATED: 285 mOsm/kg (ref 270–290)
POTASSIUM: 3.8 mmol/L (ref 3.5–5.1)
PROTEIN TOTAL: 7.4 g/dL (ref 6.4–8.2)
SODIUM: 138 mmol/L (ref 136–145)

## 2022-11-04 LAB — POC BLOOD GLUCOSE (RESULTS)
GLUCOSE, POC: 394 mg/dl — ABNORMAL HIGH (ref 70–100)
GLUCOSE, POC: 267 mg/dl — ABNORMAL HIGH (ref 70–100)
GLUCOSE, POC: 287 mg/dl — ABNORMAL HIGH (ref 70–100)

## 2022-11-04 LAB — CBC WITH DIFF
BASOPHIL #: 0.04 10*3/uL (ref 0.00–0.30)
BASOPHIL %: 0 % (ref 0–3)
EOSINOPHIL #: 0.25 10*3/uL (ref 0.00–0.80)
EOSINOPHIL %: 2 % (ref 0–7)
HCT: 36.4 % — ABNORMAL LOW (ref 37.0–47.0)
HGB: 11.8 g/dL — ABNORMAL LOW (ref 12.5–16.0)
LYMPHOCYTE #: 3.4 10*3/uL (ref 1.10–5.00)
LYMPHOCYTE %: 29 % (ref 25–45)
MCH: 27.2 pg (ref 27.0–32.0)
MCHC: 32.3 g/dL (ref 32.0–36.0)
MCV: 84.3 fL (ref 78.0–99.0)
MONOCYTE #: 0.8 10*3/uL (ref 0.00–1.30)
MONOCYTE %: 7 % (ref 0–12)
MPV: 7.5 fL (ref 7.4–10.4)
NEUTROPHIL #: 7.06 10*3/uL (ref 1.80–8.40)
NEUTROPHIL %: 61 % (ref 40–76)
PLATELETS: 397 10*3/uL (ref 140–440)
RBC: 4.32 10*6/uL (ref 4.20–5.40)
RDW: 16.3 % — ABNORMAL HIGH (ref 11.6–14.8)
WBC: 11.6 10*3/uL — ABNORMAL HIGH (ref 4.0–10.5)

## 2022-11-04 LAB — D-DIMER: D-DIMER: 375 ng/mL DDU (ref 215–500)

## 2022-11-04 MED ORDER — INSULIN REGULAR HUMAN 100 UNIT/ML INJECTION - CHARGE BY DOSE
10.0000 [IU] | INTRAMUSCULAR | Status: DC
Start: 2022-11-04 — End: 2022-11-04

## 2022-11-04 MED ORDER — INSULIN REGULAR HUMAN 100 UNIT/ML INJECTION - CHARGE BY DOSE
10.0000 [IU] | INTRAMUSCULAR | Status: AC
Start: 2022-11-04 — End: 2022-11-04
  Administered 2022-11-04: 10 [IU] via INTRAVENOUS

## 2022-11-04 MED ORDER — SODIUM CHLORIDE 0.9 % IV BOLUS
1000.0000 mL | INJECTION | Status: AC
Start: 2022-11-04 — End: 2022-11-04
  Administered 2022-11-04: 0 mL via INTRAVENOUS
  Administered 2022-11-04: 1000 mL via INTRAVENOUS

## 2022-11-04 MED ORDER — INSULIN U-100 REGULAR HUMAN 100 UNIT/ML INJECTION SOLUTION
INTRAMUSCULAR | Status: AC
Start: 2022-11-04 — End: 2022-11-04
  Filled 2022-11-04: qty 30

## 2022-11-04 NOTE — ED Nurses Note (Signed)
Patient discharged home with family.  AVS reviewed with patient/care giver.  A written copy of the AVS and discharge instructions was given to the patient/care giver.  Questions sufficiently answered as needed.  Patient/care giver encouraged to follow up with PCP as indicated.  In the event of an emergency, patient/care giver instructed to call 911 or go to the nearest emergency room.

## 2022-11-04 NOTE — ED Triage Notes (Signed)
Pt states that she has not been feeling well, dizziness, fatigue, palpitations. [facility], blood sugar checked read "HIGH" on monitor, sent [facility]. 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

## 2022-11-04 NOTE — ED Provider Notes (Signed)
[facility], [facility] Emergency Department  ED Primary Provider Note  HPI:  Karen Gilbert is a 42 y.o. female     Patient was referred to the ED by urgent care due to on unreadably elevated blood sugars..  She states with the past week or so she has had palpitations just and sensation of not feeling well.  Denies chest pain.  She also concerned about an infected toe right 3rd digit.  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 no history of venous thromboembolism.  She has not on anticoagulation.  She states she has been compliant with medications.  Not COVID vaccinated.  Full code.    ROS review and negative aside from stated in HPI.    Physical Exam:  ED Triage Vitals [11/04/22 1846]   BP (Non-Invasive) (!) 149/90   Heart Rate 90   Respiratory Rate 18   Temperature 36.6 C (97.9 F)   SpO2 98 %   Weight 105 kg (232 lb)   Height 1.803 m (5\' 11" )     No acute distress.  Patient awake alert oriented x3.  Mood is appropriate.  Pupils 3 mm equal round reactive.  Extraocular movements are intact.  Oropharynx is clear.  Mucous membranes moist.  Trachea midline.  Neck is supple.  Heart has regular rate and rhythm without significant murmurs rubs or gallops.  Lungs are clear to auscultation.  Abdomen soft nontender, nondistended.  Moving all extremities without difficulty.  No rash.  Asymmetric pitting edema right-greater-than-left lower extremity.  Co mycoses likely all digits.    Patient data:  Labs Ordered/Reviewed   VENOUS BLOOD GAS/LACTATE - Abnormal; Notable for the following components:       Result Value    PO2 (VENOUS) <20 (*)     BICARBONATE (VENOUS) 28.1 (*)     BASE EXCESS 2.3 (*)     All other components within normal limits   URINALYSIS, MACRO/MICRO - Abnormal; Notable for the following components:    COLOR Light Yellow (*)     GLUCOSE >=1000 (*)     All other components within normal limits   COMPREHENSIVE METABOLIC PANEL, NON-FASTING - Abnormal; Notable for the  following components:    CALCIUM 8.4 (*)     GLUCOSE 275 (*)     AST (SGOT) 13 (*)     All other components within normal limits    Narrative:     Estimated Glomerular Filtration Rate (eGFR) is calculated using the CKD-EPI (2021) equation, intended for patients 13 years of age and older. If gender is not documented or "unknown", there will be no eGFR calculation.   CBC WITH DIFF - Abnormal; Notable for the following components:    WBC 11.6 (*)     HGB 11.8 (*)     HCT 36.4 (*)     RDW 16.3 (*)     All other components within normal limits   POC BLOOD GLUCOSE (RESULTS) - Abnormal; Notable for the following components:    GLUCOSE, POC 394 (*)     All other components within normal limits   POC BLOOD GLUCOSE (RESULTS) - Abnormal; Notable for the following components:    GLUCOSE, POC 267 (*)     All other components within normal limits   POC BLOOD GLUCOSE (RESULTS) - Abnormal; Notable for the following components:    GLUCOSE, POC 287 (*)     All other components within normal limits   D-DIMER - Normal  URINALYSIS WITH REFLEX MICROSCOPIC AND CULTURE IF POSITIVE    Narrative:     The following orders were created for panel order URINALYSIS WITH REFLEX MICROSCOPIC AND CULTURE IF POSITIVE.  Procedure                               Abnormality         Status                     ---------                               -----------         ------                     URINALYSIS, MACRO/MICRO[[id number]]      Abnormal            Final result                 Please view results for these tests on the individual orders.   CBC/DIFF    Narrative:     The following orders were created for panel order CBC/DIFF.  Procedure                               Abnormality         Status                     ---------                               -----------         ------                     CBC WITH DIFF[[id number]]                Abnormal            Final result                 Please view results for these tests on the individual orders.    EXTRA TUBES    Narrative:     The following orders were created for panel order EXTRA TUBES.  Procedure                               Abnormality         Status                     ---------                               -----------         ------                     LIGHT GREEN TOP TUBE[[id number]]                             In process                 LAVENDER  TOP WUJW[[id number]]                                In process                   Please view results for these tests on the individual orders.   LIGHT GREEN TOP TUBE   LAVENDER TOP TUBE     No orders to display       MDM:  Elevated blood sugar right lower extremity swelling.  DX includes hyperglycemia, DKA HHS dehydration, deep vein thrombosis.  Blood pressure 149/90.  Glucose 275.  No anion gap.  Dimer within normal limits Patient given IV fluids and insulin.  With good control sugars.  Resting comfortably.  Tolerating p.o..  I did review these findings with the patient and answered her questions.  She did state she felt better following fluids.  I provided anticipatory guidance and strict return to ED instructions.  Patient comfortable with discharge      Discharged  Clinical Impression   Onychomycosis   Uncontrolled type 2 diabetes mellitus with hyperglycemia (CMS HCC) (Primary)     Medications Administered in the ED   NS bolus infusion 1,000 mL (0 mL Intravenous Stopped 11/04/22 2107)   insulin R human 100 units/mL injection (10 Units Intravenous Given 11/04/22 2007)        Current Discharge Medication List        CONTINUE these medications - NO CHANGES were made during your visit.        Details   Aimovig Autoinjector 140 mg/mL Auto-Injector  Generic drug: erenumab-aooe   140 mg, Subcutaneous, EVERY 30 DAYS  Refills: 0     albuterol sulfate 90 mcg/actuation oral inhaler  Commonly known as: PROVENTIL or VENTOLIN or PROAIR   2 Puffs, Inhalation, EVERY 6 HOURS PRN  Refills: 0     ALPRAZolam 1 mg Tablet  Commonly known as: XANAX   1 mg, Oral, 3 TIMES DAILY  PRN  Qty: 90 Tablet  Refills: 1     amLODIPine 2.5 mg Tablet  Commonly known as: NORVASC   2.5 mg, Oral, DAILY  Refills: 0     ammonium lactate 12 % Lotion  Commonly known as: LAC-HYDRIN   1 Each, Topical, 2 TIMES DAILY  Refills: 0     benzocaine 20 % Gel  Commonly known as: ORAJEL SEVERE   1 Each, Oral, DAILY PRN  Refills: 0     bethanechol chloride 25 mg Tablet  Commonly known as: URECHOLINE   25 mg, Oral, 2 TIMES DAILY  Refills: 0     budesonide-formoteroL 80-4.5 mcg/actuation oral inhaler  Commonly known as: SYMBICORT   2 Puffs, Inhalation, 2 TIMES DAILY  Refills: 0     cholecalciferol (vitamin D3) 1,250 mcg (50,000 unit) Capsule   50,000 Units, Oral, EVERY 3 DAYS  Refills: 0     dextromethorphan-quinidine 20-10 mg Capsule  Commonly known as: NUEDEXTA   1 Capsule, Oral, DAILY  Qty: 90 Capsule  Refills: 0     Diltiazem HCl 300 mg Capsule,Sustained Action 24 hr  Commonly known as: TIAZAC   300 mg, Oral, DAILY  Refills: 0     docusate sodium 100 mg Capsule  Commonly known as: COLACE   100 mg, Oral, DAILY  Refills: 0     DULoxetine 30 mg Capsule, Delayed Release(E.C.)  Commonly known as: CYMBALTA DR   90 mg, Oral, DAILY  Qty: 270 Capsule  Refills: 0     ezetimibe 10 mg Tablet  Commonly known as: ZETIA   10 mg, Oral, EVERY EVENING  Refills: 0     famotidine 40 mg Tablet  Commonly known as: PEPCID   40 mg, Oral, NIGHTLY  Refills: 0     FeroSuL 325 mg (65 mg iron) Tablet  Generic drug: ferrous sulfate   325 mg, Oral, DAILY  Refills: 0     furosemide 20 mg Tablet  Commonly known as: LASIX   20 mg, Oral, DAILY PRN  Refills: 0     gabapentin 600 mg Tablet  Commonly known as: NEURONTIN   600 mg, Oral, 3 TIMES DAILY, Two caps three times daily   Refills: 0     HumaLOG KwikPen Insulin 100 unit/mL Insulin Pen  Generic drug: insulin lispro   80 Units, Subcutaneous, 2 TIMES DAILY WITH FOOD  Refills: 0     hydroCHLOROthiazide 25 mg Tablet  Commonly known as: HYDRODIURIL   25 mg, Oral, DAILY PRN  Refills: 0     hydroxychloroquine  200 mg Tablet  Commonly known as: PLAQUENIL   200 mg, Oral, 2 TIMES DAILY  Refills: 0     hydrOXYzine pamoate 25 mg Capsule  Commonly known as: VISTARIL   25 mg, Oral, NIGHTLY PRN, 25-50 mg QHS as needed.   Qty: 90 Capsule  Refills: 1     Ibuprofen 800 mg Tablet  Commonly known as: MOTRIN   800 mg, Oral, 3 TIMES DAILY PRN  Refills: 0     Linzess 145 mcg Capsule  Generic drug: linaCLOtide   145 mcg, Oral, DAILY  Refills: 0     lurasidone 80 mg Tablet  Commonly known as: LATUDA   80 mg, Oral, DAILY, Must administer with food (at least 350 calories)  Qty: 90 Tablet  Refills: 0     magnesium oxide 400 mg Tablet  Commonly known as: MAG-OX   400 mg, Oral, 2 TIMES DAILY  Refills: 0     NOVOLOG FLEXPEN U-100 INSULIN SUBQ   1 Units, SEE COMMENT  Refills: 0     Nurtec ODT 75 mg Tablet, Rapid Dissolve  Generic drug: rimegepant   75 mg, Oral, ONCE PRN  Refills: 0     Olmesartan-Hydrochlorothiazide 40-12.5 mg Tablet   1 Tablet, Oral, DAILY  Refills: 0     ondansetron 4 mg Tablet, Rapid Dissolve  Commonly known as: ZOFRAN ODT   4 mg, Oral, EVERY 8 HOURS PRN  Qty: 12 Tablet  Refills: 0     pantoprazole 40 mg Tablet, Delayed Release (E.C.)  Commonly known as: PROTONIX   40 mg, Oral, DAILY  Refills: 0     polyethylene glycol 17 gram/dose Powder  Commonly known as: MIRALAX   Oral, DAILY PRN  Refills: 0     rosuvastatin 20 mg Tablet  Commonly known as: CRESTOR   20 mg, Oral, EVERY EVENING  Refills: 0     thyroid 180 mg Tablet  Commonly known as: ARMOUR THYROID   180 mg, Oral, DAILY  Refills: 0     topiramate 50 mg Tablet  Commonly known as: TOPAMAX   75 mg, Oral, 2 TIMES DAILY  Refills: 0     Ubrelvy 50 mg Tablet  Generic drug: ubrogepant   50 mg, ONCE PRN  Refills: 0     zolpidem 10 mg Tablet  Commonly known as: AMBIEN   10 mg, Oral, NIGHTLY  Qty: 30 Tablet  Refills:  1

## 2022-11-04 NOTE — Discharge Instructions (Signed)
Your evaluation did not reveal critical condition requiring hospitalization.  Measure blood sugars twice a day and report these numbers to your primary care provider.  You should also start taking her Lasix daily to help reduce leg swelling and keep your legs elevated as much as possible above the level of your heart.  Follow up primary care ideally within the next 3-5 days.  Return to emergency department sooner if you have uncontrollable pain fevers chills or any other concerning symptoms.  Thank you for visiting [facility]

## 2022-11-05 LAB — LIGHT GREEN TOP TUBE

## 2022-11-05 LAB — LAVENDER TOP TUBE

## 2022-11-22 ENCOUNTER — Ambulatory Visit
Payer: Medicaid Other | Attending: NURSE PRACTITIONER-PSYCHIATRIC-MENTAL HEALTH | Admitting: NURSE PRACTITIONER-PSYCHIATRIC-MENTAL HEALTH

## 2022-11-22 ENCOUNTER — Encounter (HOSPITAL_PSYCHIATRIC): Payer: Self-pay | Admitting: NURSE PRACTITIONER-PSYCHIATRIC-MENTAL HEALTH

## 2022-11-22 ENCOUNTER — Other Ambulatory Visit: Payer: Self-pay

## 2022-11-22 VITALS — BP 140/88 | HR 89 | Resp 18 | Ht 71.0 in | Wt 232.0 lb

## 2022-11-22 DIAGNOSIS — F3163 Bipolar disorder, current episode mixed, severe, without psychotic features: Secondary | ICD-10-CM | POA: Insufficient documentation

## 2022-11-22 DIAGNOSIS — F609 Personality disorder, unspecified: Secondary | ICD-10-CM | POA: Insufficient documentation

## 2022-11-22 DIAGNOSIS — F411 Generalized anxiety disorder: Secondary | ICD-10-CM | POA: Insufficient documentation

## 2022-11-22 DIAGNOSIS — G4700 Insomnia, unspecified: Secondary | ICD-10-CM | POA: Insufficient documentation

## 2022-11-22 MED ORDER — HYDROXYZINE PAMOATE 25 MG CAPSULE
25.0000 mg | ORAL_CAPSULE | Freq: Every evening | ORAL | 2 refills | Status: AC | PRN
Start: 2022-11-22 — End: ?

## 2022-11-22 MED ORDER — DEXTROMETHORPHAN 20 MG-QUINIDINE 10 MG CAPSULE
1.0000 | ORAL_CAPSULE | Freq: Every day | ORAL | 0 refills | Status: AC
Start: 2022-11-22 — End: ?

## 2022-11-22 MED ORDER — ALPRAZOLAM 1 MG TABLET
1.0000 mg | ORAL_TABLET | Freq: Three times a day (TID) | ORAL | 2 refills | Status: AC | PRN
Start: 2022-11-22 — End: ?

## 2022-11-22 MED ORDER — ZOLPIDEM 10 MG TABLET
10.0000 mg | ORAL_TABLET | Freq: Every evening | ORAL | 2 refills | Status: AC
Start: 2022-11-22 — End: ?

## 2022-11-22 MED ORDER — LURASIDONE 80 MG TABLET
80.0000 mg | ORAL_TABLET | Freq: Every day | ORAL | 0 refills | Status: AC
Start: 2022-11-22 — End: ?

## 2022-11-22 MED ORDER — DULOXETINE 30 MG CAPSULE,DELAYED RELEASE
90.0000 mg | DELAYED_RELEASE_CAPSULE | Freq: Every day | ORAL | 0 refills | Status: AC
Start: 2022-11-22 — End: ?

## 2022-11-22 NOTE — Progress Notes (Signed)
[facility]  BEHAVIORAL MEDICINE, [facility]  Operated by [facility]  Progress Note    Name: Karen Gilbert MRN:  [id number]   Date: 11/22/2022 Age: 42 y.o.       Chief Complaint: Generalized Anxiety and Bipolar Disorder (Personality d/o/Mood d/o)    Subjective:   Present illness:  Patient here for follow-up on her depression, anxiety, insomnia.  Today the patient states she is "doing okay. "  She has ongoing depression anxiety due to numerous psychosocial stressors.  She is has some financial issues and is still waiting on getting her disability approved.  She has numerous chronic medical conditions and chronic pain.  She has been diagnosed with Sjogren's and RA.  Most of the appointment was spent allowing her time to vent frustrations about finances and ongoing medical issues.  She is in spirits today She is pleasant and talkative.  She has her grandchildren and they are getting ready to spend the day at the park.  She states that things have been going well overall since her last appointment.  She does not want to make any changes to medications.    She frequently feels overwhelmed.   Overall her mood has been stable.  No reports of any suicidal ideation.  No self-harm behavior.  He struggles with poor self-esteem.  She has chronic pain which contributes to her symptoms as well.   Offered support and encouragement which easily accepts. No reports of any increase in irritability or mood swings.      Risk benefits, side effect potential of medication reiterated with good understanding verbalized.  We discussed side effect potential of antipsychotics including EPS, NMS, TD and metabolic syndrome reiterated with good understanding verbalized.  Benzodiazepine precautions reiterated with good understanding verbalized.    Refill sent today.  She would like to follow-up in 2-3 months or sooner for any new problems or complications.  No reports of panic attacks.      She has a good appetite and has been eating well.    She denies any suicidal or homicidal ideations.  No attempts to self-harm.   She denies any auditory or visual hallucinations, delusions or paranoia.     No other complaints voiced.       PHQ9=19        Board of Pharmacy:  Unable to review on 11/22/2022, UDS obtained 12/25/21 and was positive for benzodiazepines as expected no other illicit substances noted.     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 daily  Vistaril 25-50 milligrams p.o. nightly as needed  Ambien 10 mg p.o. 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  They are alert to time place person  and situation.  Speech is within normal limits.  Mood is "okay" affect is congruent.  There are no auditory or visual tactile hallucinations.  Thought content is within normal limits there is no reported self-harm thoughts no reported suicidal thoughts.  No aggressive thoughts.  Axis linear logical goal-directed.  Associations within normal limits.  Language within normal limits.  Recent memory is intact as evidenced by can state the name of provider and reports with a had for the last meal.  Remote memory is intact as evidenced by able to report when married.  Immediate memory is intact as evidenced by provider correct details of the current situation.   Intellectual functioning is good as evidenced by can name the president, vice president and vocabulary is consistent with known level of education.  Insight is fair as evidenced by sees self as having a psychiatric illness; aware of symptoms; and senses  level of illness affects her functioning.  Judgment is fair as evidenced by has been compliant with treatment.  Objective :  BP (!) 140/88 (Site: Left, Patient Position: Sitting)   Pulse 89   Resp 18   Ht 1.803 m (5\' 11" )   Wt 105 kg (232 lb)   BMI 32.36 kg/m         Data reviewed:    Current Outpatient Medications   Medication Sig    albuterol sulfate (PROVENTIL OR VENTOLIN OR PROAIR) 90 mcg/actuation Inhalation oral inhaler Take 2 Puffs by inhalation Every 6 hours as needed    ALPRAZolam (XANAX) 1 mg Oral Tablet Take 1 Tablet (1 mg total) by mouth Three times a day as needed for Anxiety    amLODIPine (NORVASC) 2.5 mg Oral Tablet Take 1 Tablet (2.5 mg total) by mouth Once a day    ammonium lactate (LAC-HYDRIN) 12 % Lotion Apply 1 Each topically Twice daily    benzocaine (ORAJEL SEVERE) 20 % Mucous Membrane Gel Take 1 Each by mouth Once per day as needed    bethanechol chloride (URECHOLINE) 25 mg Oral Tablet Take 1 Tablet (25 mg total) by mouth Twice daily    budesonide-formoteroL (SYMBICORT) 80-4.5 mcg/actuation Inhalation oral inhaler Take 2 Puffs by inhalation Twice daily    cholecalciferol, vitamin D3, 1,250 mcg (50,000 unit) Oral Capsule Take 1 Capsule (50,000 Units total) by mouth Every 3 days    dextromethorphan-quinidine (NUEDEXTA) 20-10 mg Oral Capsule Take 1 Capsule by mouth Once a day    Diltiazem HCl (TIAZAC) 300 mg Oral Capsule,Sustained Action 24 hr Take 1 Capsule (300 mg total) by mouth Once a day    docusate sodium (COLACE) 100 mg Oral Capsule Take 1 Capsule (100 mg total) by mouth Once a day    DULoxetine (CYMBALTA DR) 30 mg Oral Capsule, Delayed Release(E.C.) Take 3 Capsules (90 mg total) by mouth Once a day    erenumab-aooe (AIMOVIG  AUTOINJECTOR) 140 mg/mL Subcutaneous Auto-Injector Inject 1 mL (140 mg total) under the skin Every 30 days    ezetimibe (ZETIA) 10 mg Oral Tablet Take 1 Tablet (10 mg total) by mouth Every evening    famotidine (PEPCID) 40 mg Oral Tablet Take 1 Tablet (40 mg total) by mouth Every night    FEROSUL 325 mg (65 mg iron) Oral Tablet Take 1 Tablet (325 mg total) by mouth Once a day    furosemide (LASIX) 20 mg Oral Tablet Take 1 Tablet (20 mg total) by mouth Once per day as needed    gabapentin (NEURONTIN) 600 mg Oral Tablet Take 2.5 Tablets (  1,500 mg total) by mouth Three times a day Two caps three times daily    HUMALOG KWIKPEN INSULIN 100 unit/mL Subcutaneous Insulin Pen Inject 80 Units under the skin Twice daily with food    hydrochlorothiazide (HYDRODIURIL) 25 mg Oral Tablet Take 1 Tablet (25 mg total) by mouth Once per day as needed    hydrOXYchloroQUINE (PLAQUENIL) 200 mg Oral Tablet Take 1 Tablet (200 mg total) by mouth Twice daily    hydrOXYzine pamoate (VISTARIL) 25 mg Oral Capsule Take 1 Capsule (25 mg total) by mouth Every night as needed 25-50 mg QHS as needed.    Ibuprofen (MOTRIN) 800 mg Oral Tablet Take 1 Tablet (800 mg total) by mouth Three times a day as needed    insulin aspart (NOVOLOG FLEXPEN U-100 INSULIN SUBQ) Inject 1 Units under the skin Per instructions (INSULIN PUMP) (Patient not taking: Reported on 11/22/2022)    LINZESS 145 mcg Oral Capsule Take 1 Capsule (145 mcg total) by mouth Once a day    lurasidone (LATUDA) 80 mg Oral Tablet Take 1 Tablet (80 mg total) by mouth Once a day Must administer with food (at least 350 calories)    magnesium oxide (MAG-OX) 400 mg Oral Tablet Take 1 Tablet (400 mg total) by mouth Twice daily    Olmesartan-Hydrochlorothiazide 40-12.5 mg Oral Tablet Take 1 Tablet by mouth Once a day    ondansetron (ZOFRAN ODT) 4 mg Oral Tablet, Rapid Dissolve Take 1 Tablet (4 mg total) by mouth Every 8 hours as needed for Nausea/Vomiting    pantoprazole (PROTONIX) 40 mg Oral Tablet,  Delayed Release (E.C.) Take 1 Tablet (40 mg total) by mouth Once a day    polyethylene glycol (MIRALAX) 17 gram/dose Oral Powder Take by mouth Once per day as needed (constipation)    rimegepant (NURTEC ODT) 75 mg Oral Tablet, Rapid Dissolve Take 1 Tablet (75 mg total) by mouth Once, as needed (migraine)    rosuvastatin (CRESTOR) 20 mg Oral Tablet Take 1 Tablet (20 mg total) by mouth Every evening    thyroid (ARMOUR THYROID) 180 mg Oral Tablet Take 1 Tablet (180 mg total) by mouth Once a day    topiramate (TOPAMAX) 50 mg Oral Tablet Take 1.5 Tablets (75 mg total) by mouth Twice daily    ubrogepant (UBRELVY) 50 mg Oral Tablet Take 1 Tablet (50 mg total) by mouth Once, as needed (Patient not taking: Reported on 08/12/2022)    zolpidem (AMBIEN) 10 mg Oral Tablet Take 1 Tablet (10 mg total) by mouth Every night     Assessment/Plan  Continue Cymbalta  90 mg daily, Nuedexta 20/10 mg daily,  xanax 1 mg 3 times daily, and continue Latuda 80 mg daily.  Vistaril 25-50mg  p.o. nightly as needed for sleep, Ambien 10 mg p.o. nightly sent in to [facility].  Patient given written and verbal instructions regarding good sleep hygiene.  Reportable signs and symptoms, risks, benefits and potential side effects reiterated with good understanding verbalized.  Patient agreeable to following up in 2-3 months or sooner should there be any new problems or complications.  Pt verbalized understanding, denied questions, agreed to treatment plan.     Continue medications as ordered.  Referred patient to [facility] for counseling due to numerous psychosocial stressors.  Advised patient to contact clinic in case of need and crisis line in case of emergency  Patient is at low risk of self harm and does not warrant inpatient hospitalization at this time and does not present as dangerous  to self / others or gravely disabled at this time.  Advised to take all medications as direct and keep all follow-up appointments for primary and  specialty medical care.  Advised to notify MD or go to nearest ER for new/worsening symptoms.  Advised to avoid alcohol, tobacco, and illicit drugs.  After standard discussion of risks, benefits, side effects, indications, therapeutic rationale, and alternatives (including doing nothing), patient gives informed consent re: medications.  Patient verbalized understanding and agreement with treatment plan.     Moderate level of medical decision making includes review of old records, discussion of diagnosis and symptoms, review of PHQ-9, review of symptoms, patient education, discussion of prescirbed medications and side effects, discussion of psychosocial stressors, and review of prescription monitoring program information. I offered support and encouragement.    Carlen Fils, PMHNP-BC

## 2022-12-04 ENCOUNTER — Other Ambulatory Visit: Payer: Self-pay

## 2022-12-04 ENCOUNTER — Emergency Department
Admission: EM | Admit: 2022-12-04 | Discharge: 2022-12-04 | Disposition: A | Discharge status: Home / Self Care | Payer: Medicaid Other | Attending: Emergency Medicine | Admitting: Emergency Medicine

## 2022-12-04 ENCOUNTER — Encounter (HOSPITAL_BASED_OUTPATIENT_CLINIC_OR_DEPARTMENT_OTHER): Payer: Self-pay

## 2022-12-04 ENCOUNTER — Emergency Department (HOSPITAL_BASED_OUTPATIENT_CLINIC_OR_DEPARTMENT_OTHER): Payer: Medicaid Other

## 2022-12-04 DIAGNOSIS — X58XXXA Exposure to other specified factors, initial encounter: Secondary | ICD-10-CM

## 2022-12-04 DIAGNOSIS — S93402A Sprain of unspecified ligament of left ankle, initial encounter: Secondary | ICD-10-CM | POA: Insufficient documentation

## 2022-12-04 DIAGNOSIS — X501XXA Overexertion from prolonged static or awkward postures, initial encounter: Secondary | ICD-10-CM | POA: Insufficient documentation

## 2022-12-04 DIAGNOSIS — S86002A Unspecified injury of left Achilles tendon, initial encounter: Secondary | ICD-10-CM | POA: Insufficient documentation

## 2022-12-04 MED ORDER — TRAMADOL 50 MG TABLET
100.0000 mg | ORAL_TABLET | ORAL | Status: AC
Start: 2022-12-04 — End: 2022-12-04
  Administered 2022-12-04: 100 mg via ORAL

## 2022-12-04 MED ORDER — TRAMADOL 50 MG TABLET
ORAL_TABLET | ORAL | Status: AC
Start: 2022-12-04 — End: 2022-12-04
  Filled 2022-12-04: qty 2

## 2022-12-04 MED ORDER — TRAMADOL 37.5 MG-ACETAMINOPHEN 325 MG TABLET
1.0000 | ORAL_TABLET | Freq: Four times a day (QID) | ORAL | 0 refills | Status: DC | PRN
Start: 2022-12-04 — End: 2023-08-09

## 2022-12-04 NOTE — ED Provider Notes (Signed)
[facility], [facility] Emergency Department  ED Primary Provider Note  History of Present Illness   Chief Complaint   Patient presents with    Leg Pain    Ankle Pain     left     Arrival: The patient arrived by Car  Karen Gilbert is a 42 y.o. female who had concerns including Leg Pain and Ankle Pain. Pt states on going achilles tendonitis. Stepped on  a rock and twisted left ankle felt more pain and swelling can do partial wt  bearing.     Review of Systems   Constitutional: No fever, chills or weakness   Skin: No rash or diaphoresis  HENT: No headaches, or congestion  Eyes: No vision changes or photophobia   Cardio: No chest pain, palpitations or leg swelling   Respiratory: No cough, wheezing or SOB  GI:  No nausea, vomiting or stool changes  GU:  No dysuria, hematuria, or increased frequency  MSK: No muscle aches, +joint  pain  Neuro: No seizures, LOC, numbness, tingling, or focal weakness  Psychiatric: No depression, SI or substance abuse  All other systems reviewed and are negative.    History Reviewed This Encounter: all noted and reviewed    Physical Exam   ED Triage Vitals [12/04/22 1617]   BP (Non-Invasive) (!) 149/87   Heart Rate (!) 105   Respiratory Rate 18   Temperature 37.2 C (99 F)   SpO2 99 %   Weight 105 kg (232 lb)   Height 1.803 m (5\' 11" )       Constitutional:  42 y.o. female who appears in no distress. Normal color, no cyanosis.   HENT:   Head: Normocephalic and atraumatic.   Mouth/Throat: Oropharynx is clear and moist.   Eyes: EOMI, PERRL   Neck: Trachea midline. Neck supple.  Cardiovascular: RRR, No murmurs, rubs or gallops. Intact distal pulses.  Pulmonary/Chest: BS equal bilaterally. No respiratory distress. No wheezes, rales or chest tenderness.   Abdominal: Bowel sounds present and normal. Abdomen soft, no tenderness, no rebound and no guarding.  Back: No midline spinal tenderness, no paraspinal tenderness, no CVA tenderness.            Musculoskeletal: + moderate to large lateral left ankle and posterior ankle edema, tenderness no  deformity. Neg calf exam   Skin: warm and dry. No rash, erythema, pallor or cyanosis  Psychiatric: normal mood and affect. Behavior is normal.   Neurological: Patient keenly alert and responsive, easily able to raise eyebrows, facial muscles/expressions symmetric, speaking in fluent sentences, moving all extremities equally and fully, normal gait  Patient Data   Labs Ordered/Reviewed - No data to display  XR ANKLE LEFT   Final Result by Edi, Radresults In (07/07 1644)   SOFT TISSUE SWELLING. NO  FRACTURE IDENTIFIED.                   Radiologist location ID: [id number]           Medical Decision Making   Dif dx of ankle sprain fx tenonitis tendon rupture.             Medications Administered in the ED   traMADol (ULTRAM) tablet (100 mg Oral Given 12/04/22 1631)   Xray neg. Applied boot. Felt better   Clinical Impression   Left ankle sprain (Primary)   Achilles tendon injury, left, initial encounter       Disposition: Discharged

## 2022-12-04 NOTE — ED Triage Notes (Signed)
Had Achilles  tendonitis  then yesterday stepped off a rock and suddenly had extreme pain in back of left posterior ankle  heard something pop

## 2022-12-04 NOTE — ED Nurses Note (Signed)
Patient discharged home. Reviewed instructions and prescriptions with patient. Questions sufficiently answered as needed. Patient verbalized understanding of instructions and prescriptions. Patient encouraged to follow up with PCP as indicated. In the event of an emergency, patient instructed to call 911 or go to the nearest emergency room. Patient ambulated off the unit.

## 2022-12-04 NOTE — ED Nurses Note (Signed)
Patient c/o left ankle and posterior leg pain since yesterday after "stepping in a hole." Reports hx achilles tendon pain. Rates pain 8/10 with ambulation. No acute distress noted.

## 2022-12-04 NOTE — ED Nurses Note (Signed)
Walking boot applied to left leg. Patient tolerated well.

## 2022-12-05 ENCOUNTER — Telehealth (HOSPITAL_COMMUNITY): Payer: Self-pay | Admitting: NURSE PRACTITIONER

## 2022-12-05 NOTE — Telephone Encounter (Signed)
Post Ed Follow-Up    Post ED Follow-Up:   Document completed and/or attempted interactive contact(s) after transition to home after emergency department stay.:   Transition Facility and relevant Date:   Discharge Date: 12/04/22  Discharge from [facility]?: Yes  Discharge Facility: [facility]  Contacted by: Park Liter, BSN RN  Contact method: Patient/Caregiver Telephone  Contact first attempt: 12/05/2022  3:30 PM  Was the AVS reviewed with patient?: Yes  How is the patient recovering?: Improving  Medications prescribed: Yes  Were they obtained?: No  Barriers to obtaining meds: has not been to pharmacy yet  Patient states she has an appointment scheduled with Dr Lindwood Qua, Ortho for 12/06/22. Patient states she will pick up her Ultracet later today. Patient verbalizes understanding of instructions, information, education and has no further questions.

## 2023-01-09 IMAGING — MR MRI ANKLE LT W WO CONTRAST
6 of 9 series · 24 of 40 positions shown · IV contrast (gadavist)
Comparison: None available.

﻿EXAM:  18108   MRI ANKLE LT W WO CONTRAST
INDICATION: Left ankle pain, posterior Achilles pain. Twisting injury in November 2022.  Swelling.  No prior surgery.
TECHNIQUE: Multiplanar, multisequential MRI of the left ankle was performed including postcontrast after 10 mL Gadavist IV.

[Series 8: T1 · sagittal · left · 3.5mm · 0.38mm/px · 3 of 20 slices shown (1 of 3)]
[im 1/20]
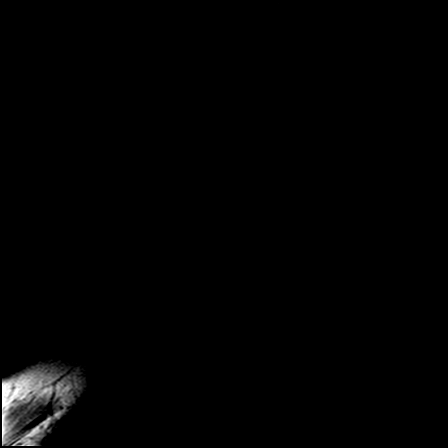
[im 10/20]
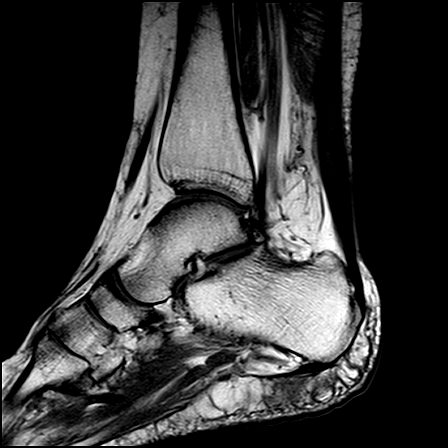
[im 20/20]
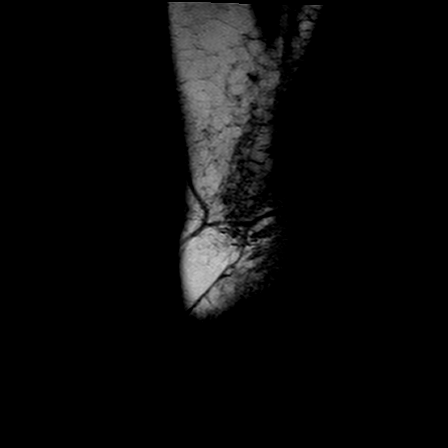

[Series 9: T2 fat-sat · sagittal · left · 3.5mm · 0.38mm/px · 3 of 20 slices shown (1 of 2)]
[im 1/20]
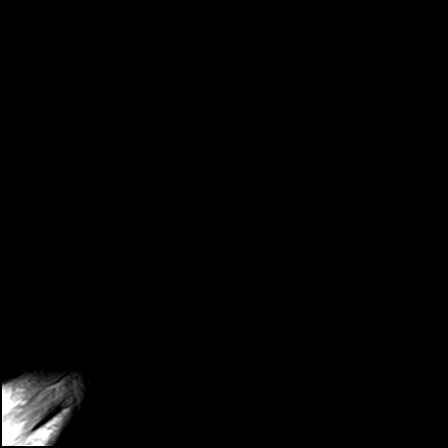
[im 10/20]
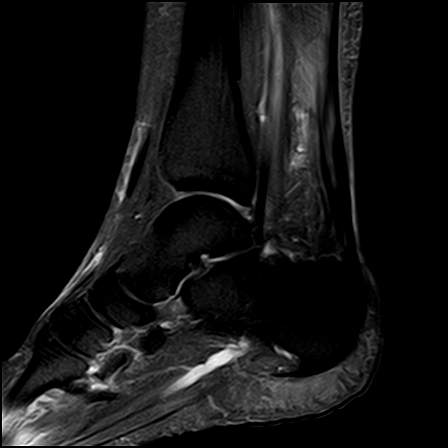
[im 20/20]
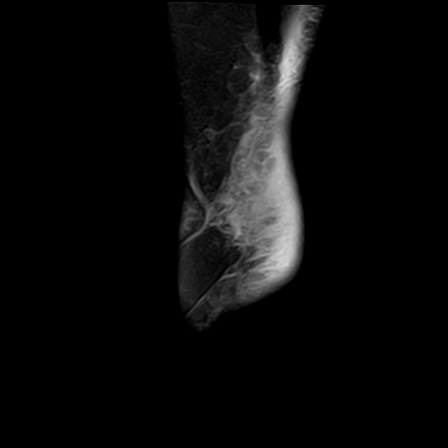

[Series 10: T1 · coronal · left · 4.0mm · 0.33mm/px · 5 of 26 slices shown (2 of 3)]
[im 1/26]
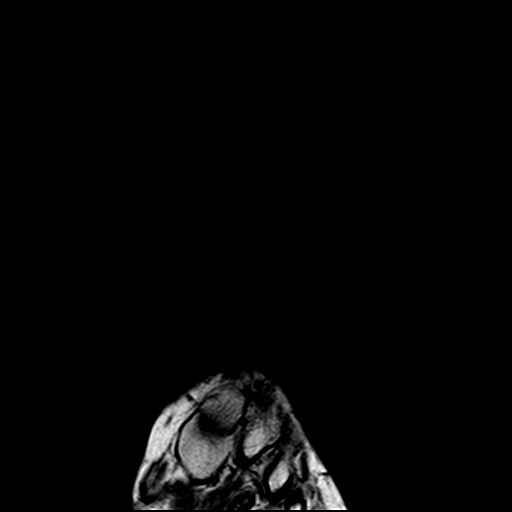
[im 7/26]
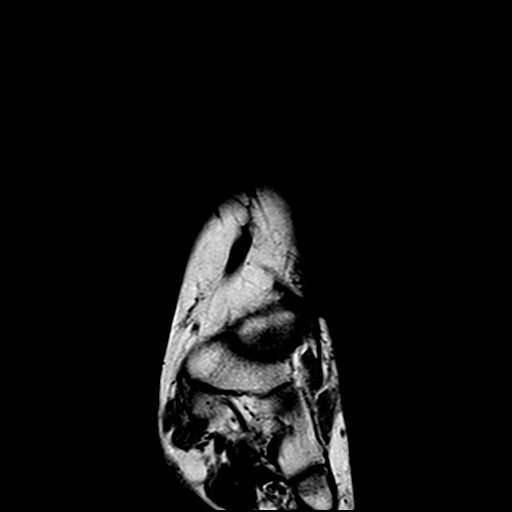
[im 13/26]
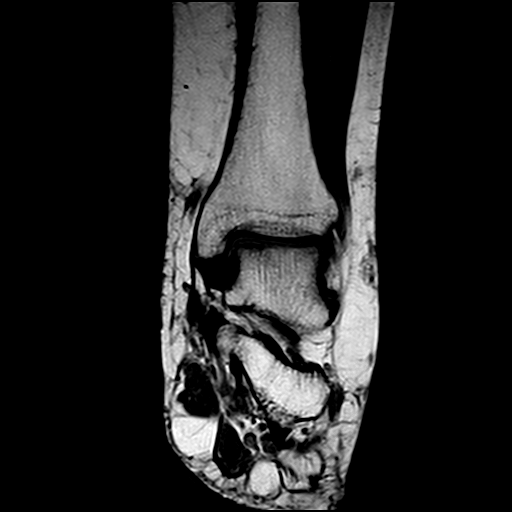
[im 19/26]
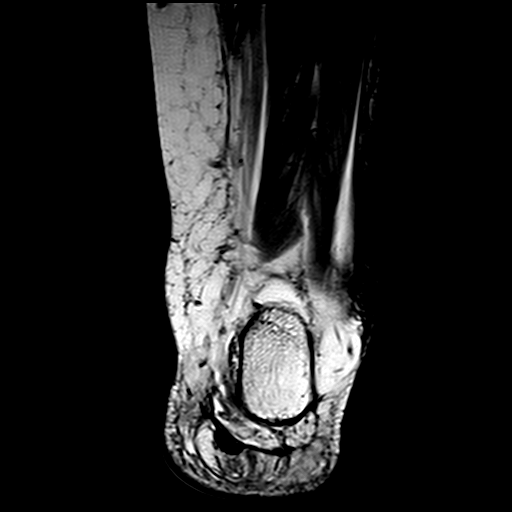
[im 26/26]
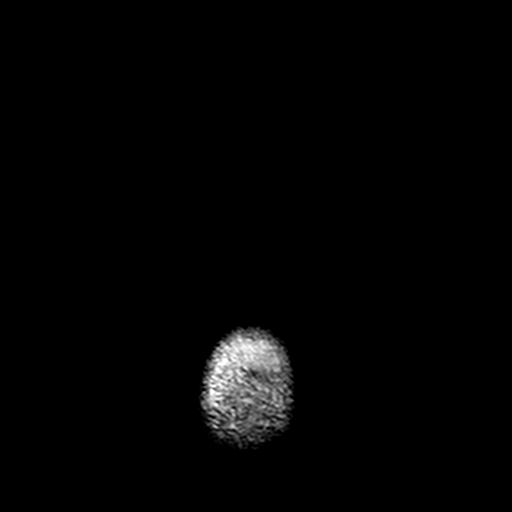

[Series 12: T1 · axial · left · 4.5mm · 0.29mm/px · z∈[-45,+95]mm · 5 of 29 slices shown (3 of 3)]
[im 1/29]
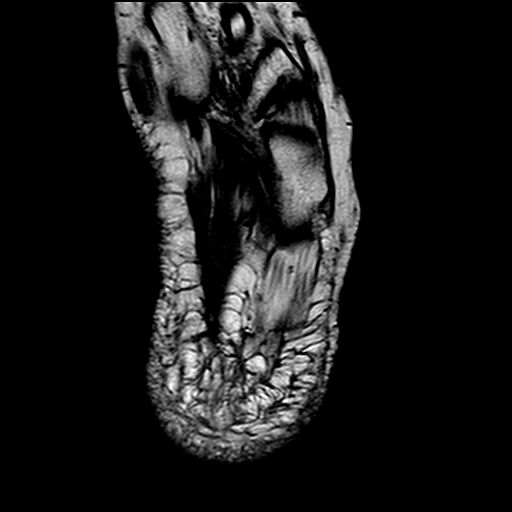
[im 8/29]
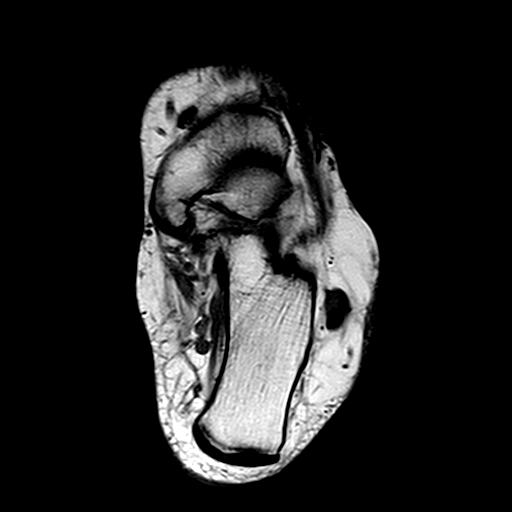
[im 15/29]
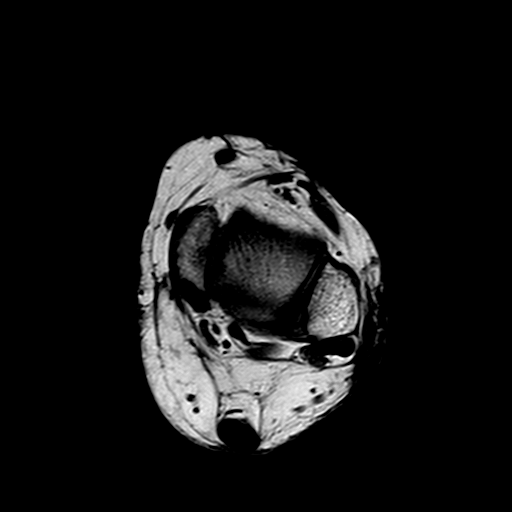
[im 22/29]
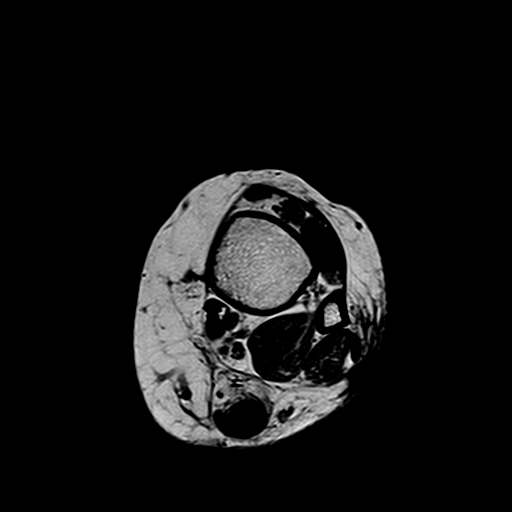
[im 29/29]
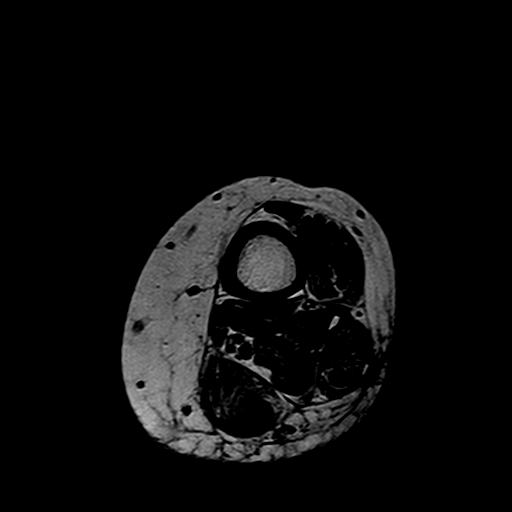

[Series 13: T2 fat-sat · axial · left · 4.5mm · 0.33mm/px · z∈[-45,+95]mm · 5 of 29 slices shown (2 of 2)]
[im 1/29]
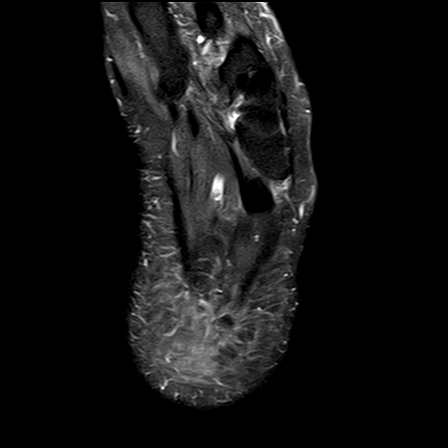
[im 8/29]
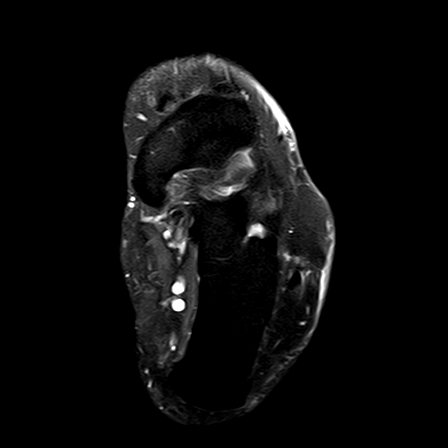
[im 15/29]
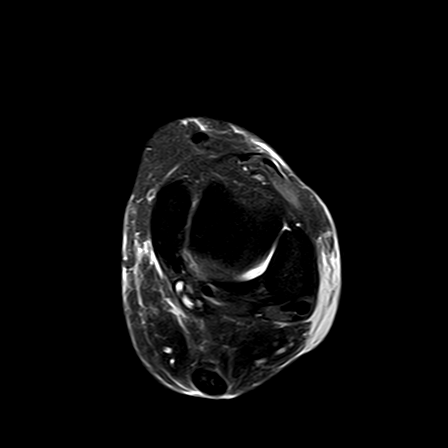
[im 22/29]
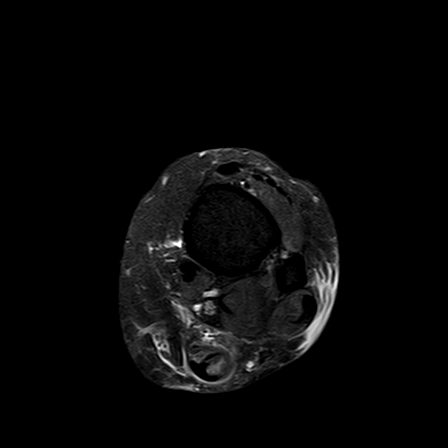
[im 29/29]
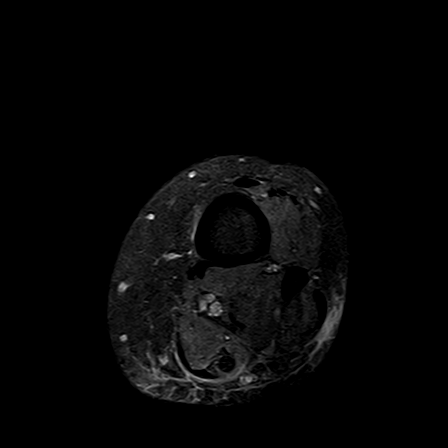

[Series 14: T1 fat-sat · axial · left · 4.5mm · 0.47mm/px · z∈[-45,+25]mm · 3 of 29 slices shown]
[im 1/29]
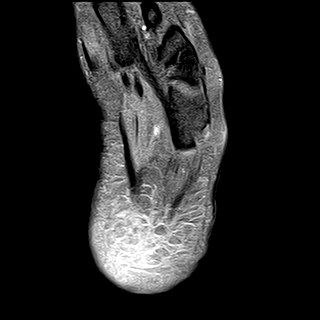
[im 8/29]
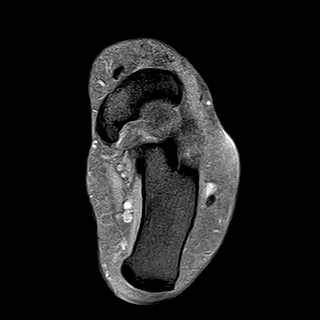
[im 15/29]
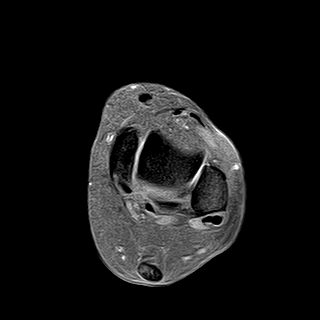

[24 of 40 positions shown; findings below may reference images not displayed]

FINDINGS: No acute bony lesions are seen at the left ankle.

Medial and lateral collateral ligaments are intact.  There is no widening of ankle mortise.  Distal tibiofibular syndesmosis is intact.

Significant abnormality of the Achilles tendon is noted involving a length of approximately 3.3 cm, approximately 4 cm proximal to the insertion into the calcaneus.  Significant thickening with T2 signal increase within the tendon is noted suggestive of high-grade partial-thickness tear and tendinopathy of the Achilles tendon.  Enhancement at this site on postcontrast suggests significant inflammatory changes.

Peroneal tendons are intact.  Medial tendons of the ankle are intact.  Subcutaneous edema is noted over the lateral aspect of the ankle.
IMPRESSION: 1. No acute fractures or bone bruises are seen at the left ankle. 

2. Significant abnormality of 3.3 cm in length of Achilles tendon, 4 cm proximal to the insertion into the calcaneus as described above in detail suggestive of high-grade but partial-thickness disruption of the Achilles tendon.

3. Subcutaneous edema over the lateral aspect of the ankle.

## 2023-04-04 ENCOUNTER — Emergency Department
Admission: EM | Admit: 2023-04-04 | Discharge: 2023-04-04 | Disposition: A | Discharge status: Home / Self Care | Payer: Medicaid Other | Attending: Emergency Medicine | Admitting: Emergency Medicine

## 2023-04-04 ENCOUNTER — Other Ambulatory Visit: Payer: Self-pay

## 2023-04-04 ENCOUNTER — Encounter (HOSPITAL_BASED_OUTPATIENT_CLINIC_OR_DEPARTMENT_OTHER): Payer: Self-pay

## 2023-04-04 ENCOUNTER — Emergency Department (HOSPITAL_BASED_OUTPATIENT_CLINIC_OR_DEPARTMENT_OTHER): Payer: Medicaid Other

## 2023-04-04 DIAGNOSIS — Z5982 Transportation insecurity: Secondary | ICD-10-CM | POA: Insufficient documentation

## 2023-04-04 DIAGNOSIS — K029 Dental caries, unspecified: Secondary | ICD-10-CM | POA: Insufficient documentation

## 2023-04-04 DIAGNOSIS — I10 Essential (primary) hypertension: Secondary | ICD-10-CM

## 2023-04-04 DIAGNOSIS — Z5986 Financial insecurity: Secondary | ICD-10-CM | POA: Insufficient documentation

## 2023-04-04 DIAGNOSIS — M2740 Unspecified cyst of jaw: Secondary | ICD-10-CM

## 2023-04-04 DIAGNOSIS — K047 Periapical abscess without sinus: Secondary | ICD-10-CM | POA: Insufficient documentation

## 2023-04-04 HISTORY — DX: Unspecified osteoarthritis, unspecified site: M19.90

## 2023-04-04 HISTORY — DX: Sjogren syndrome, unspecified: M35.00

## 2023-04-04 HISTORY — DX: Fatty (change of) liver, not elsewhere classified: K76.0

## 2023-04-04 LAB — COMPREHENSIVE METABOLIC PANEL, NON-FASTING
ALBUMIN/GLOBULIN RATIO: 1 (ref 0.8–1.4)
ALBUMIN: 3.6 g/dL (ref 3.4–5.0)
ALKALINE PHOSPHATASE: 86 U/L (ref 46–116)
ALT (SGPT): 18 U/L (ref <=78)
ANION GAP: 11 mmol/L (ref 4–13)
AST (SGOT): 9 U/L — ABNORMAL LOW (ref 15–37)
BILIRUBIN TOTAL: 0.3 mg/dL (ref 0.2–1.0)
BUN/CREA RATIO: 8
BUN: 7 mg/dL (ref 7–18)
CALCIUM, CORRECTED: 8.5 mg/dL
CALCIUM: 8.2 mg/dL — ABNORMAL LOW (ref 8.5–10.1)
CHLORIDE: 99 mmol/L (ref 98–107)
CO2 TOTAL: 26 mmol/L (ref 21–32)
CREATININE: 0.84 mg/dL (ref 0.55–1.02)
ESTIMATED GFR: 88 mL/min/{1.73_m2} (ref >59)
GLOBULIN: 3.7
GLUCOSE: 375 mg/dL — ABNORMAL HIGH (ref 74–106)
OSMOLALITY, CALCULATED: 285 mosm/kg (ref 270–290)
POTASSIUM: 4 mmol/L (ref 3.5–5.1)
PROTEIN TOTAL: 7.3 g/dL (ref 6.4–8.2)
SODIUM: 136 mmol/L (ref 136–145)

## 2023-04-04 LAB — CBC WITH DIFF
BASOPHIL #: 0.03 10*3/uL (ref 0.00–0.30)
BASOPHIL %: 0 % (ref 0–3)
EOSINOPHIL #: 0.35 10*3/uL (ref 0.00–0.80)
EOSINOPHIL %: 3 % (ref 1–7)
HCT: 38.8 % (ref 37.0–47.0)
HGB: 12.8 g/dL (ref 12.5–16.0)
LYMPHOCYTE #: 3.38 10*3/uL (ref 1.10–5.00)
LYMPHOCYTE %: 32 % (ref 25–45)
MCH: 27.8 pg (ref 27.0–32.0)
MCHC: 32.9 g/dL (ref 32.0–36.0)
MCV: 84.5 fL (ref 78.0–99.0)
MONOCYTE #: 0.62 10*3/uL (ref 0.00–1.30)
MONOCYTE %: 6 % (ref 0–12)
MPV: 7.4 fL (ref 7.4–10.4)
NEUTROPHIL #: 6.05 10*3/uL (ref 1.80–8.40)
NEUTROPHIL %: 58 % (ref 40–76)
PLATELETS: 358 10*3/uL (ref 140–440)
RBC: 4.59 10*6/uL (ref 4.20–5.40)
RDW: 15.6 % — ABNORMAL HIGH (ref 11.6–14.8)
WBC: 10.4 10*3/uL (ref 4.0–10.5)

## 2023-04-04 LAB — ECG 12 LEAD
Atrial Rate: 88 {beats}/min
Calculated P Axis: 63 degrees
Calculated R Axis: 61 degrees
Calculated T Axis: 43 degrees
PR Interval: 152 ms
QRS Duration: 88 ms
QT Interval: 344 ms
QTC Calculation: 416 ms
Ventricular rate: 88 {beats}/min

## 2023-04-04 LAB — LACTIC ACID LEVEL W/ REFLEX FOR LEVEL >2.0: LACTIC ACID: 1.8 mmol/L (ref 0.4–2.0)

## 2023-04-04 NOTE — ED Provider Notes (Signed)
 Emergency Medicine      Name: Karen Gilbert  Age and Gender: 42 y.o. female  Date of Birth: [birth year]  MRN: [id number]  PCP: Yaakov Guthrie, FNP    CC:  Chief Complaint   Patient presents with    Mouth Pain       HPI:  Karen Gilbert is a 42 y.o. White female who presents to the ER with facial pain and edema to the left cheek.  Patient currently undergoing treatment for dental abscess of the upper front teeth.    Below pertinent information reviewed with patient:  Past Medical History:   Diagnosis Date    Arthritis     Asthma     Diabetes mellitus type II     Fatty liver     HTN (hypertension)     Hyperlipidemia     Migraine     Myocardial infarction (CMS HCC)     Sjogren's syndrome            Allergies   Allergen Reactions    Codeine Itching    Naproxen Swelling    Motrin [Ibuprofen] Swelling     Swelling in legs       Past Surgical History:   Procedure Laterality Date    CYSTOSCOPY N/A 10/07/2021    Performed by Budd Palmer, DO at [facility]    HX CHOLECYSTECTOMY      HX HYSTERECTOMY      HX SHOULDER ARTHROSCOPY      HX TONSILLECTOMY      HX TUBAL LIGATION      LAPAROSCOPIC ASSISTED VAGINAL NOTES HYSTERECTOMY; BILATERAL SALPINGECTOMY; LEFT OOPHORECTOMY N/A 10/07/2021    Performed by Budd Palmer, DO at [facility]        Social History     Socioeconomic History    Marital status: Legally Separated   Occupational History     Employer: [employer]   Tobacco Use    Smoking status: Never     Passive exposure: Current    Smokeless tobacco: Never   Vaping Use    Vaping status: Never Used   Substance and Sexual Activity    Alcohol use: No    Drug use: Never     Social Determinants of Health     Financial Resource Strain: High Risk (01/26/2022)    Financial Resource Strain     SDOH Financial: Yes, multiple.   Transportation Needs: High Risk (01/26/2022)    Transportation Needs     SDOH Transportation: Yes, it has kept me from medical appointments or from getting my medications   Social  Connections: Medium Risk (01/26/2022)    Social Connections     SDOH Social Isolation: 1 or 2 times a week   Housing Stability: Low Risk  (01/26/2022)    Housing Stability     SDOH Housing Situation: I have housing.     SDOH Housing Worry: No       ROS:  No other overt positive review of systems are noted other than stated in the HPI.      Objective:    ED Triage Vitals [04/04/23 1806]   BP (Non-Invasive) (!) 146/102   Heart Rate 96   Respiratory Rate 18   Temperature 37.6 C (99.7 F)   SpO2 98 %   Weight 105 kg (232 lb)   Height 1.803 m (5\' 11" )     Filed Vitals:    04/04/23 1806   BP: (!) 146/102  Pulse: 96   Resp: 18   Temp: 37.6 C (99.7 F)   SpO2: 98%       Nursing notes and vital signs reviewed.    Constitutional - No acute distress.  Alert and Active.  HEENT - Normocephalic. Atraumatic. PERRL. EOMI. Conjunctiva clear. TM's pearly grey, translucent, without bulging or retraction. Oropharynx with no erythema, lesions, or exudates. Moist mucous membranes.  Multiple dental fractures with decay noted upper gum front.  No obvious abscess noted.  Left cheek edema with tenderness on palpation.  Left nasal turbinate hypertrophy with purulent drainage.  Neck - Trachea midline. No stridor. No hoarseness.  Cardiac - Regular rate and rhythm. No murmurs, rubs, or gallops. Intact distal pulses.  Respiratory/Chest - Normal respiratory effort. Clear to auscultation bilaterally. No rales, wheezes or rhonchi. No chest tenderness.  Abdomen - Normal bowel sounds. Non-tender, soft, non-distended. No rebound or guarding.   Musculoskeletal - Good AROM. No muscle or joint tenderness appreciated. No clubbing, cyanosis or edema.  Skin - Warm and dry, without any rashes or other lesions.  Neuro - Alert and oriented x 3. Cranial nerves II-XII are grossly intact.  Moving all extremities symmetrically. Normal gait.  Psych - Normal mood and affect. Behavior is normal                  Any pertinent labs and imaging obtained during this  encounter reviewed below in MDM.    MDM/ED Course:        Medical Decision Making  Karen Gilbert is a 42 y.o. White female who presents to the ER with facial pain and edema to the left cheek.  Patient currently undergoing treatment for dental abscess of the upper front teeth.    Multiple fractured with decaying teeth noted in the front upper gum.  Patient is currently undergoing treatment for abscess in preparation for dental extraction.  Patient states "I feel like the infection is spreading, my face is hurting especially the left cheek and left nose hole".    Amount and/or Complexity of Data Reviewed  Labs: ordered.  Radiology: ordered. Decision-making details documented in ED Course.  ECG/medicine tests: ordered and independent interpretation performed. Decision-making details documented in ED Course.                ED Course as of 04/04/23 2116   Tue Apr 04, 2023   2017 ECG 12 LEAD  EKG reviewed  Normal sinus rhythm  Normal ECG  Ventricular rate 88 bpm  PR interval 152 ms  QRS duration 88 ms  QT/QTC 344/416 ms   2025 CBC/DIFF(!)  RDW slightly elevated 15.6   2110 COMPREHENSIVE METABOLIC PANEL, NON-FASTING(!)  Calcium below normal 8.2, glucose elevated 375 and AST below normal at 9   2110 LACTIC ACID LEVEL W/ REFLEX FOR LEVEL >2.0  Normal   2111 CT FACIAL BONES WO IV CONTRAST  FINDINGS:  Bones: No fracture. Dental caries. No obvious periapical lucencies.     Sinuses: Small left maxillary mucus retention cyst. No mucosal thickening     Orbits: Unremarkable     Soft Tissues: No focal inflammatory changes or evidence of abscess. Prominent left submandibular node measures 15 mm in length. Prominent right level II lymph node measures 18 mm in length.     IMPRESSION:  1.DENTAL CARIES WITHOUT EVIDENCE OF PERIAPICAL OR SOFT TISSUE ABSCESS.  2.NO EVIDENCE OF SINUSITIS. SMALL INCIDENTAL LEFT MAXILLARY MUCUS RETENTION CYST.        2116 Discussed results and  plan of care.  Patient indicated and verbalized understanding  and agreement.       Orders Placed This Encounter    CT FACIAL BONES WO IV CONTRAST    URINALYSIS WITH REFLEX MICROSCOPIC AND CULTURE IF POSITIVE    CBC/DIFF    COMPREHENSIVE METABOLIC PANEL, NON-FASTING    LACTIC ACID LEVEL W/ REFLEX FOR LEVEL >2.0    URINALYSIS, MACRO/MICRO    CBC WITH DIFF    ECG 12 LEAD         Any procedures:      Impression:   Clinical Impression   Maxillary cyst (Primary)       Disposition: Discharged    / Corinna Lines, FNP-BC  04/04/2023, 19:23  [facility]  Department of Emergency Medicine  [facility]    Portions of this note may have been dictated using voice recognition software.     -----------------------  Results for orders placed or performed during the hospital encounter of 04/04/23 (from the past 12 hour(s))   COMPREHENSIVE METABOLIC PANEL, NON-FASTING   Result Value Ref Range    SODIUM 136 136 - 145 mmol/L    POTASSIUM 4.0 3.5 - 5.1 mmol/L    CHLORIDE 99 98 - 107 mmol/L    CO2 TOTAL 26 21 - 32 mmol/L    ANION GAP 11 4 - 13 mmol/L    BUN 7 7 - 18 mg/dL    CREATININE 2.84 1.32 - 1.02 mg/dL    BUN/CREA RATIO 8     ESTIMATED GFR 88 >59 mL/min/1.45m^2    ALBUMIN 3.6 3.4 - 5.0 g/dL    CALCIUM 8.2 (L) 8.5 - 10.1 mg/dL    GLUCOSE 440 (H) 74 - 106 mg/dL    ALKALINE PHOSPHATASE 86 46 - 116 U/L    ALT (SGPT) 18 <=78 U/L    AST (SGOT) 9 (L) 15 - 37 U/L    BILIRUBIN TOTAL 0.3 0.2 - 1.0 mg/dL    PROTEIN TOTAL 7.3 6.4 - 8.2 g/dL    ALBUMIN/GLOBULIN RATIO 1.0 0.8 - 1.4    OSMOLALITY, CALCULATED 285 270 - 290 mOsm/kg    CALCIUM, CORRECTED 8.5 mg/dL    GLOBULIN 3.7    LACTIC ACID LEVEL W/ REFLEX FOR LEVEL >2.0   Result Value Ref Range    LACTIC ACID 1.8 0.4 - 2.0 mmol/L   CBC WITH DIFF   Result Value Ref Range    WBC 10.4 4.0 - 10.5 x10^3/uL    RBC 4.59 4.20 - 5.40 x10^6/uL    HGB 12.8 12.5 - 16.0 g/dL    HCT 10.2 72.5 - 36.6 %    MCV 84.5 78.0 - 99.0 fL    MCH 27.8 27.0 - 32.0 pg    MCHC 32.9 32.0 - 36.0 g/dL    RDW 44.0 (H) 34.7 - 14.8 %    PLATELETS 358 140 - 440  x10^3/uL    MPV 7.4 7.4 - 10.4 fL    NEUTROPHIL % 58 40 - 76 %    LYMPHOCYTE % 32 25 - 45 %    MONOCYTE % 6 0 - 12 %    EOSINOPHIL % 3 1 - 7 %    BASOPHIL % 0 0 - 3 %    NEUTROPHIL # 6.05 1.80 - 8.40 x10^3/uL    LYMPHOCYTE # 3.38 1.10 - 5.00 x10^3/uL    MONOCYTE # 0.62 0.00 - 1.30 x10^3/uL    EOSINOPHIL # 0.35 0.00 - 0.80 x10^3/uL    BASOPHIL #  0.03 0.00 - 0.30 x10^3/uL     CT FACIAL BONES WO IV CONTRAST   Final Result   1. DENTAL CARIES WITHOUT EVIDENCE OF PERIAPICAL OR SOFT TISSUE ABSCESS.   2. NO EVIDENCE OF SINUSITIS. SMALL INCIDENTAL LEFT MAXILLARY MUCUS RETENTION CYST.         One or more dose reduction techniques were used (e.g., Automated exposure control, adjustment of the mA and/or kV according to patient size, use of iterative reconstruction technique).         Radiologist location ID: [id number]

## 2023-04-04 NOTE — ED Nurses Note (Signed)
 Provider in room to speak with patient about test results.

## 2023-04-04 NOTE — ED Triage Notes (Signed)
On Clindamycin for a week for an infection left upper teeth.  States I think its getting worse.  Pain spreading to nose and face.  States has been taking a lot of meds for pain.  Took Motrin 1600 mg, Tylenol 1000 mg and one Tramadol at 0900 without improvement.

## 2023-04-04 NOTE — ED Nurses Note (Signed)
Patient discharged home all instructions and test results gone over with patient with verbalized understanding. Patient ambulated off unit independently.

## 2023-04-04 NOTE — Discharge Instructions (Signed)
Please follow up with your primary care provider.  Please follow up with your dentist.    Return to the emergency department if symptoms persist, worsen or new symptoms of concern develop.    Thank you for allowing [place] to be part of your care.    Please discuss all medications with your pharmacist to ensure there are no concerns of interactions.    Please ensure all questions or concerns are addressed prior to leaving the hospital. We want to make sure your concerns are addressed to make sure you are as safe and healthy as possible. By leaving the hospital, it is understood you are in agreement with your treatment plan.    You may have received sedating medication during your visit. Please discuss this with your discharging provider nurse as you may not be able to operate machines while the medication is in your system, or while you are taking any potentially sedating prescriptions.    Please call the hospital medical records office for a copy of your finalized results, and review them with a primary care physician, for any findings needing further attention.    If you feel your situation worsens, or does not get better in 48 hours, please see a physician for evaluation.    We encourage you to see your regular doctor as soon as possible to let them know you were seen in the emergency department. They may want to do further testing. If you do not have a doctor, please feel free to call the hospital, and ask for contact information of accepting providers. Please also discuss your vaccinations, and ensure all are up to date.    You may use this document to take today off work or school.

## 2023-04-05 ENCOUNTER — Telehealth (HOSPITAL_COMMUNITY): Payer: Self-pay

## 2023-04-05 NOTE — Telephone Encounter (Signed)
Post Ed Follow-Up    Post ED Follow-Up:   Document completed and/or attempted interactive contact(s) after transition to home after emergency department stay.:   Transition Facility and relevant Date:   Discharge Date: 04/04/23  Discharge from [facility] Emergency Department?: Yes  Discharge Facility: [facility]  Contacted by: Jobie Quaker RN  Contact method: Patient/Caregiver Telephone, MyChart Patient Portal  Contact completed: 04/05/2023 12:52 PM  MyChart message sent?: Yes  Medications prescribed: No  Unable to reach patient via phone. Left VM and sent MyChart message re: ED discharge requesting call back for questions/concerns. Advised of Nurse Navigator number and services provided.     Jobie Quaker RN  [employer]

## 2023-04-20 NOTE — Telephone Encounter (Signed)
Contacted patient concerning prescription, Plaquenil sent to pharmacy. Instructed patient to go to urgent care or PCP for her flare up.

## 2023-04-20 NOTE — Telephone Encounter (Signed)
Plaquenil refill sent, Denied Prednisone due to elevated glucose levels, Instructed patient to go to nearest Er or Urgent Care for pain with flare ups

## 2023-05-17 ENCOUNTER — Ambulatory Visit (HOSPITAL_COMMUNITY): Payer: Self-pay

## 2023-05-17 ENCOUNTER — Other Ambulatory Visit (HOSPITAL_COMMUNITY): Payer: Self-pay | Admitting: Orthopaedic Surgery

## 2023-05-17 DIAGNOSIS — R2242 Localized swelling, mass and lump, left lower limb: Secondary | ICD-10-CM

## 2023-05-18 ENCOUNTER — Other Ambulatory Visit: Payer: Self-pay

## 2023-05-18 ENCOUNTER — Ambulatory Visit
Admission: RE | Admit: 2023-05-18 | Discharge: 2023-05-18 | Disposition: A | Discharge status: Home / Self Care | Payer: Medicaid Other | Source: Ambulatory Visit | Attending: Orthopaedic Surgery | Admitting: Orthopaedic Surgery

## 2023-05-18 DIAGNOSIS — R2242 Localized swelling, mass and lump, left lower limb: Secondary | ICD-10-CM | POA: Insufficient documentation

## 2023-06-06 ENCOUNTER — Ambulatory Visit (INDEPENDENT_AMBULATORY_CARE_PROVIDER_SITE_OTHER): Payer: Self-pay | Admitting: NURSE PRACTITIONER

## 2023-06-22 ENCOUNTER — Ambulatory Visit (INDEPENDENT_AMBULATORY_CARE_PROVIDER_SITE_OTHER): Payer: Self-pay | Admitting: Orthopaedic Surgery

## 2023-07-05 ENCOUNTER — Ambulatory Visit (INDEPENDENT_AMBULATORY_CARE_PROVIDER_SITE_OTHER): Payer: Self-pay | Admitting: NURSE PRACTITIONER

## 2023-07-13 ENCOUNTER — Ambulatory Visit (INDEPENDENT_AMBULATORY_CARE_PROVIDER_SITE_OTHER): Payer: Medicaid Other | Admitting: Orthopaedic Surgery

## 2023-07-18 ENCOUNTER — Ambulatory Visit (INDEPENDENT_AMBULATORY_CARE_PROVIDER_SITE_OTHER): Payer: Self-pay | Admitting: NURSE PRACTITIONER

## 2023-07-28 ENCOUNTER — Ambulatory Visit: Attending: Family

## 2023-07-28 ENCOUNTER — Other Ambulatory Visit (HOSPITAL_COMMUNITY): Payer: Self-pay | Admitting: Family

## 2023-07-28 ENCOUNTER — Other Ambulatory Visit: Payer: Self-pay

## 2023-07-28 DIAGNOSIS — K760 Fatty (change of) liver, not elsewhere classified: Secondary | ICD-10-CM

## 2023-07-29 LAB — CBC WITH DIFF
BASOPHIL #: 0.1 10*3/uL (ref 0.00–0.10)
BASOPHIL %: 1 % (ref 0–1)
EOSINOPHIL #: 0.2 10*3/uL (ref 0.00–0.50)
EOSINOPHIL %: 2 % (ref 1–7)
HCT: 40 % (ref 31.2–41.9)
HGB: 13 g/dL (ref 10.9–14.3)
LYMPHOCYTE #: 3.2 10*3/uL — ABNORMAL HIGH (ref 1.10–3.10)
LYMPHOCYTE %: 28 % (ref 16–46)
MCH: 27.1 pg (ref 24.7–32.8)
MCHC: 32.5 g/dL (ref 32.3–35.6)
MCV: 83.4 fL (ref 75.5–95.3)
MONOCYTE #: 0.7 10*3/uL (ref 0.20–0.90)
MONOCYTE %: 6 % (ref 4–11)
MPV: 8.1 fL (ref 7.9–10.8)
NEUTROPHIL #: 7.5 10*3/uL (ref 1.90–8.20)
NEUTROPHIL %: 64 % (ref 43–77)
PLATELETS: 385 10*3/uL (ref 140–440)
RBC: 4.8 10*6/uL (ref 3.63–4.92)
RDW: 14.2 % (ref 12.3–17.7)
WBC: 11.6 10*3/uL (ref 3.8–11.8)

## 2023-07-30 LAB — COMPREHENSIVE METABOLIC PANEL, NON-FASTING
ALBUMIN/GLOBULIN RATIO: 1.3 (ref 0.8–1.4)
ALBUMIN: 4.4 g/dL (ref 3.5–5.7)
ALKALINE PHOSPHATASE: 78 U/L (ref 34–104)
ALT (SGPT): 15 U/L (ref 7–52)
ANION GAP: 13 mmol/L (ref 4–13)
AST (SGOT): 14 U/L (ref 13–39)
BILIRUBIN TOTAL: 0.4 mg/dL (ref 0.3–1.0)
BUN/CREA RATIO: 16 (ref 6–22)
BUN: 14 mg/dL (ref 7–25)
CALCIUM, CORRECTED: 9 mg/dL (ref 8.9–10.8)
CALCIUM: 9.3 mg/dL (ref 8.6–10.3)
CHLORIDE: 95 mmol/L — ABNORMAL LOW (ref 98–107)
CO2 TOTAL: 24 mmol/L (ref 21–31)
CREATININE: 0.89 mg/dL (ref 0.60–1.30)
ESTIMATED GFR: 82 mL/min/{1.73_m2} (ref >59)
GLOBULIN: 3.4 (ref 2.0–3.5)
GLUCOSE: 506 mg/dL (ref 74–109)
OSMOLALITY, CALCULATED: 288 mosm/kg (ref 270–290)
POTASSIUM: 4.6 mmol/L (ref 3.5–5.1)
PROTEIN TOTAL: 7.8 g/dL (ref 6.4–8.9)
SODIUM: 132 mmol/L — ABNORMAL LOW (ref 136–145)

## 2023-08-01 LAB — ALPHA FETOPROTEIN (AFP) TUMOR MARKER: AFP TUMOR MARKER: 2 ng/mL (ref <=9)

## 2023-08-09 ENCOUNTER — Encounter (INDEPENDENT_AMBULATORY_CARE_PROVIDER_SITE_OTHER): Payer: Self-pay | Admitting: NURSE PRACTITIONER

## 2023-08-09 ENCOUNTER — Other Ambulatory Visit: Payer: Self-pay

## 2023-08-09 ENCOUNTER — Ambulatory Visit: Payer: Self-pay | Attending: NURSE PRACTITIONER | Admitting: NURSE PRACTITIONER

## 2023-08-09 VITALS — Ht 71.0 in | Wt 235.0 lb

## 2023-08-09 DIAGNOSIS — H9203 Otalgia, bilateral: Secondary | ICD-10-CM | POA: Insufficient documentation

## 2023-08-09 DIAGNOSIS — K029 Dental caries, unspecified: Secondary | ICD-10-CM | POA: Insufficient documentation

## 2023-08-09 DIAGNOSIS — J342 Deviated nasal septum: Secondary | ICD-10-CM | POA: Insufficient documentation

## 2023-08-09 DIAGNOSIS — J343 Hypertrophy of nasal turbinates: Secondary | ICD-10-CM | POA: Insufficient documentation

## 2023-08-09 DIAGNOSIS — R519 Headache, unspecified: Secondary | ICD-10-CM | POA: Insufficient documentation

## 2023-08-09 DIAGNOSIS — M26609 Unspecified temporomandibular joint disorder, unspecified side: Secondary | ICD-10-CM | POA: Insufficient documentation

## 2023-08-09 NOTE — H&P (Unsigned)
 ENT, [facility]  [address]  [place] [address]  Phone: [phone]  Fax: [phone]      Encounter Date: 08/09/2023    Patient ID: Karen Gilbert  MRN: [id number]    DOB: 10-18-1980  Age: 43 y.o. female         Referring Provider:    Yaakov Guthrie, FNP  [address]  [place],  [place] [address]    Reason for Visit: No chief complaint on file.       History of Present Illness:  Karen Gilbert is a 43 y.o. 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)    Personality disorder, unspecified (CMS HCC)     Current Outpatient Medications   Medication Sig    albuterol sulfate (PROVENTIL OR VENTOLIN OR PROAIR) 90 mcg/actuation Inhalation oral inhaler Take 2 Puffs by inhalation Every 6 hours as needed    ALPRAZolam (XANAX) 1 mg Oral Tablet Take 1 Tablet (1 mg total) by mouth Three times a day as needed for Anxiety    ammonium lactate (LAC-HYDRIN) 12 % Lotion Apply 1 Each topically Twice daily    benzocaine (ORAJEL SEVERE) 20 % Mucous Membrane Gel Take 1 Each by mouth Once per day as needed    bethanechol chloride (URECHOLINE) 25 mg Oral Tablet Take 1 Tablet (25 mg total) by mouth Twice daily    budesonide-formoteroL (SYMBICORT) 80-4.5 mcg/actuation Inhalation oral inhaler Take 2 Puffs by inhalation Twice daily    carvediloL (COREG) 25 mg Oral Tablet Take 1 Tablet (25 mg total) by mouth Once a day    cholecalciferol, vitamin D3, 1,250 mcg (50,000 unit) Oral Capsule Take 1 Capsule (50,000 Units total) by mouth Every 7 days    cloNIDine HCL (CATAPRES) 0.1 mg Oral Tablet Take 1 Tablet (0.1 mg total) by mouth Every night    dextromethorphan-quinidine (NUEDEXTA) 20-10 mg Oral Capsule Take 1 Capsule by mouth Once a day    docusate sodium (COLACE) 100 mg Oral Capsule Take 1 Capsule (100 mg total)  by mouth Once a day    DULoxetine (CYMBALTA DR) 30 mg Oral Capsule, Delayed Release(E.C.) Take 3 Capsules (90 mg total) by mouth Once a day    erenumab-aooe (AIMOVIG AUTOINJECTOR) 140 mg/mL Subcutaneous Auto-Injector Inject 1 mL (140 mg total) under the skin Every 30 days    ezetimibe (ZETIA) 10 mg Oral Tablet Take 1 Tablet (10 mg total) by mouth Every evening    famotidine (PEPCID) 40 mg Oral Tablet Take 1 Tablet (40 mg total) by mouth Every night    FEROSUL 325 mg (65 mg iron) Oral Tablet Take 1 Tablet (325 mg total) by mouth Once a day    furosemide (LASIX) 20 mg Oral Tablet Take 1 Tablet (20 mg total) by mouth Once per day as needed    gabapentin (NEURONTIN) 600 mg Oral Tablet Take 1 Tablet (600 mg total) by mouth Twice daily    gabapentin (NEURONTIN) 600 mg Oral Tablet 1.5 Tablets (900 mg total) Every night    HUMALOG KWIKPEN INSULIN 100 unit/mL Subcutaneous Insulin Pen Inject 80 Units under the skin Three times daily with meals    hydrochlorothiazide (HYDRODIURIL) 25 mg Oral Tablet Take 1 Tablet (25 mg total) by mouth Once per day as needed  hydrOXYchloroQUINE (PLAQUENIL) 200 mg Oral Tablet Take 1 Tablet (200 mg total) by mouth Twice daily    hydrOXYzine pamoate (VISTARIL) 25 mg Oral Capsule Take 1 Capsule (25 mg total) by mouth Every night as needed 25-50 mg QHS as needed.    Ibuprofen (MOTRIN) 800 mg Oral Tablet Take 1 Tablet (800 mg total) by mouth Three times a day as needed    insulin aspart (NOVOLOG FLEXPEN U-100 INSULIN SUBQ) Inject 1 Units under the skin Per instructions (INSULIN PUMP) (Patient not taking: Reported on 11/22/2022)    LINZESS 145 mcg Oral Capsule Take 2 Capsules (290 mcg total) by mouth Once a day    lurasidone (LATUDA) 80 mg Oral Tablet Take 1 Tablet (80 mg total) by mouth Once a day Must administer with food (at least 350 calories)    Olmesartan-Hydrochlorothiazide 40-12.5 mg Oral Tablet Take 1 Tablet by mouth Once a day    ondansetron (ZOFRAN ODT) 4 mg Oral Tablet, Rapid Dissolve  Take 1 Tablet (4 mg total) by mouth Every 8 hours as needed for Nausea/Vomiting    pantoprazole (PROTONIX) 40 mg Oral Tablet, Delayed Release (E.C.) Take 1 Tablet (40 mg total) by mouth Once a day    polyethylene glycol (MIRALAX) 17 gram/dose Oral Powder Take by mouth Once per day as needed (constipation)    rimegepant (NURTEC ODT) 75 mg Oral Tablet, Rapid Dissolve Take 1 Tablet (75 mg total) by mouth Once, as needed (migraine)    rosuvastatin (CRESTOR) 20 mg Oral Tablet Take 1 Tablet (20 mg total) by mouth Every evening    thyroid (ARMOUR THYROID) 180 mg Oral Tablet Take 1 Tablet (180 mg total) by mouth Once a day    topiramate (TOPAMAX) 50 mg Oral Tablet Take 1.5 Tablets (75 mg total) by mouth Twice daily    traMADol-acetaminophen (ULTRACET) 37.5-325 mg Oral Tablet Take 1 Tablet by mouth Every 6 hours as needed    zolpidem (AMBIEN) 10 mg Oral Tablet Take 1 Tablet (10 mg total) by mouth Every night     Allergies   Allergen Reactions    Codeine Itching    Naproxen Swelling    Motrin [Ibuprofen] Swelling     Swelling in legs     Past Medical History:   Diagnosis Date    Arthritis     Asthma     Diabetes mellitus type II     Fatty liver     HTN (hypertension)     Hyperlipidemia     Migraine     Myocardial infarction (CMS HCC)     Sjogren's syndrome       Past Surgical History:   Procedure Laterality Date    HX CHOLECYSTECTOMY      HX HYSTERECTOMY      HX SHOULDER ARTHROSCOPY      HX TONSILLECTOMY      HX TUBAL LIGATION        Family Medical History:    None         Social History     Tobacco Use    Smoking status: Never     Passive exposure: Current    Smokeless tobacco: Never   Vaping Use    Vaping status: Never Used   Substance Use Topics    Alcohol use: No    Drug use: Never       Review of Systems     There were no vitals filed for this visit.   ENT Physical Exam     Assessment:  No diagnosis found.    Plan:  Medical records reviewed on 08/09/2023.  ***  No orders of the defined types were placed in this  encounter.    No follow-ups on file.    Elnora Morrison, FNP-BC  08/09/2023, 08:34

## 2023-08-17 ENCOUNTER — Ambulatory Visit (INDEPENDENT_AMBULATORY_CARE_PROVIDER_SITE_OTHER): Payer: Medicaid Other | Admitting: Orthopaedic Surgery

## 2023-08-25 ENCOUNTER — Ambulatory Visit (HOSPITAL_COMMUNITY)

## 2023-09-22 ENCOUNTER — Ambulatory Visit (HOSPITAL_COMMUNITY)

## 2023-10-20 ENCOUNTER — Ambulatory Visit
Admission: RE | Admit: 2023-10-20 | Discharge: 2023-10-20 | Disposition: A | Discharge status: Home / Self Care | Source: Ambulatory Visit | Attending: Family | Admitting: Family

## 2023-10-20 ENCOUNTER — Other Ambulatory Visit: Payer: Self-pay

## 2023-10-20 DIAGNOSIS — K760 Fatty (change of) liver, not elsewhere classified: Secondary | ICD-10-CM | POA: Insufficient documentation

## 2023-11-28 ENCOUNTER — Other Ambulatory Visit: Payer: Self-pay

## 2023-11-28 ENCOUNTER — Emergency Department (HOSPITAL_BASED_OUTPATIENT_CLINIC_OR_DEPARTMENT_OTHER)

## 2023-11-28 ENCOUNTER — Emergency Department
Admission: EM | Admit: 2023-11-28 | Discharge: 2023-11-28 | Disposition: A | Discharge status: Home / Self Care | Attending: Family | Admitting: Family

## 2023-11-28 ENCOUNTER — Encounter (HOSPITAL_BASED_OUTPATIENT_CLINIC_OR_DEPARTMENT_OTHER): Payer: Self-pay

## 2023-11-28 DIAGNOSIS — E1165 Type 2 diabetes mellitus with hyperglycemia: Secondary | ICD-10-CM | POA: Insufficient documentation

## 2023-11-28 DIAGNOSIS — L03031 Cellulitis of right toe: Secondary | ICD-10-CM | POA: Insufficient documentation

## 2023-11-28 DIAGNOSIS — L600 Ingrowing nail: Secondary | ICD-10-CM

## 2023-11-28 DIAGNOSIS — R739 Hyperglycemia, unspecified: Secondary | ICD-10-CM

## 2023-11-28 DIAGNOSIS — E860 Dehydration: Secondary | ICD-10-CM | POA: Insufficient documentation

## 2023-11-28 DIAGNOSIS — L03115 Cellulitis of right lower limb: Secondary | ICD-10-CM | POA: Insufficient documentation

## 2023-11-28 DIAGNOSIS — E11628 Type 2 diabetes mellitus with other skin complications: Secondary | ICD-10-CM

## 2023-11-28 DIAGNOSIS — M19071 Primary osteoarthritis, right ankle and foot: Secondary | ICD-10-CM

## 2023-11-28 DIAGNOSIS — M7731 Calcaneal spur, right foot: Secondary | ICD-10-CM

## 2023-11-28 DIAGNOSIS — M7989 Other specified soft tissue disorders: Secondary | ICD-10-CM | POA: Insufficient documentation

## 2023-11-28 LAB — CBC WITH DIFF
BASOPHIL #: 0.02 10*3/uL (ref 0.00–0.10)
BASOPHIL %: 0 % (ref 0–1)
EOSINOPHIL #: 0.24 10*3/uL (ref 0.00–0.50)
EOSINOPHIL %: 3 % (ref 1–7)
HCT: 35.9 % (ref 31.2–41.9)
HGB: 12.3 g/dL (ref 10.9–14.3)
LYMPHOCYTE #: 2.52 10*3/uL (ref 1.10–3.10)
LYMPHOCYTE %: 31 % (ref 16–46)
MCH: 28.8 pg (ref 24.7–32.8)
MCHC: 34.2 g/dL (ref 32.3–35.6)
MCV: 84.2 fL (ref 75.5–95.3)
MONOCYTE #: 0.52 10*3/uL (ref 0.20–0.90)
MONOCYTE %: 6 % (ref 4–11)
MPV: 7.4 fL — ABNORMAL LOW (ref 7.9–10.8)
NEUTROPHIL #: 4.94 10*3/uL (ref 1.90–8.20)
NEUTROPHIL %: 60 % (ref 43–77)
PLATELETS: 294 10*3/uL (ref 140–440)
RBC: 4.26 10*6/uL (ref 3.63–4.92)
RDW: 16.4 % (ref 12.3–17.7)
WBC: 8.3 10*3/uL (ref 3.8–11.8)

## 2023-11-28 LAB — BLOOD GAS W/ LACTATE REFLEX
%FIO2 (VENOUS): 21 %
BASE EXCESS: 2.3 mmol/L (ref 0.0–3.0)
BICARBONATE (VENOUS): 26.1 mmol/L (ref 22.0–29.0)
LACTATE: 1.9 mmol/L (ref <=1.9)
O2 SATURATION (VENOUS): 73.2 % (ref 40.0–85.0)
PCO2 (VENOUS): 39 mmHg — ABNORMAL LOW (ref 41–51)
PH (VENOUS): 7.44 — ABNORMAL HIGH (ref 7.32–7.43)
PO2 (VENOUS): 37 mmHg (ref 35–50)

## 2023-11-28 LAB — COMPREHENSIVE METABOLIC PANEL, NON-FASTING
ALBUMIN/GLOBULIN RATIO: 0.9 (ref 0.8–1.4)
ALBUMIN: 3.5 g/dL (ref 3.4–5.0)
ALKALINE PHOSPHATASE: 82 U/L (ref 46–116)
ALT (SGPT): 24 U/L (ref <=78)
ANION GAP: 8 mmol/L (ref 4–13)
AST (SGOT): 11 U/L — ABNORMAL LOW (ref 15–37)
BILIRUBIN TOTAL: 0.2 mg/dL (ref 0.2–1.0)
BUN/CREA RATIO: 11
BUN: 9 mg/dL (ref 7–18)
CALCIUM, CORRECTED: 9.3 mg/dL
CALCIUM: 8.9 mg/dL (ref 8.5–10.1)
CHLORIDE: 98 mmol/L (ref 98–107)
CO2 TOTAL: 29 mmol/L (ref 21–32)
CREATININE: 0.85 mg/dL (ref 0.55–1.02)
ESTIMATED GFR: 86 mL/min/{1.73_m2} (ref >59)
GLOBULIN: 3.9
GLUCOSE: 380 mg/dL — ABNORMAL HIGH (ref 74–106)
OSMOLALITY, CALCULATED: 285 mosm/kg (ref 270–290)
POTASSIUM: 4.1 mmol/L (ref 3.5–5.1)
PROTEIN TOTAL: 7.4 g/dL (ref 6.4–8.2)
SODIUM: 135 mmol/L — ABNORMAL LOW (ref 136–145)

## 2023-11-28 LAB — COVID-19, FLU A/B, RSV RAPID BY PCR
INFLUENZA VIRUS TYPE A: NOT DETECTED
INFLUENZA VIRUS TYPE B: NOT DETECTED
RESPIRATORY SYNCTIAL VIRUS (RSV): NOT DETECTED
SARS-CoV-2: NOT DETECTED

## 2023-11-28 LAB — URINALYSIS, MACRO/MICRO
BILIRUBIN: NEGATIVE mg/dL
BLOOD: NEGATIVE mg/dL
GLUCOSE: >=1000 mg/dL — AB
KETONES: NEGATIVE mg/dL
LEUKOCYTES: NEGATIVE WBCs/uL
NITRITE: NEGATIVE
PH: 7 (ref 4.6–8.0)
PROTEIN: NEGATIVE mg/dL
SPECIFIC GRAVITY: 1.01 (ref 1.003–1.035)
UROBILINOGEN: 0.2 mg/dL (ref 0.2–1.0)

## 2023-11-28 LAB — ACETONE: ACETONE: NEGATIVE

## 2023-11-28 LAB — LACTIC ACID LEVEL W/ REFLEX FOR LEVEL >2.0: LACTIC ACID: 2.1 mmol/L — ABNORMAL HIGH (ref 0.4–2.0)

## 2023-11-28 LAB — RAPID THROAT SCREEN, STREPTOCOCCUS, WITH REFLEX: THROAT RAPID SCREEN, STREPTOCOCCUS: NEGATIVE

## 2023-11-28 LAB — MAGNESIUM: MAGNESIUM: 1.7 mg/dL — ABNORMAL LOW (ref 1.8–2.4)

## 2023-11-28 MED ORDER — INSULIN LISPRO 100 UNIT/ML SUB-Q - CHARGE BY DOSE
5.0000 [IU] | Freq: Once | SUBCUTANEOUS | Status: DC
Start: 2023-11-28 — End: 2023-11-28

## 2023-11-28 MED ORDER — SODIUM CHLORIDE 0.9 % (FLUSH) INJECTION SYRINGE
3.0000 mL | INJECTION | Freq: Three times a day (TID) | INTRAMUSCULAR | Status: DC
Start: 2023-11-28 — End: 2023-11-28

## 2023-11-28 MED ORDER — SODIUM CHLORIDE 0.9 % IV BOLUS
1000.0000 mL | INJECTION | Status: AC
Start: 2023-11-28 — End: 2023-11-28
  Administered 2023-11-28: 1000 mL via INTRAVENOUS
  Administered 2023-11-28: 0 mL via INTRAVENOUS

## 2023-11-28 MED ORDER — ONDANSETRON HCL (PF) 4 MG/2 ML INJECTION SOLUTION
4.0000 mg | INTRAMUSCULAR | Status: AC
Start: 2023-11-28 — End: 2023-11-28
  Administered 2023-11-28: 4 mg via INTRAVENOUS

## 2023-11-28 MED ORDER — ONDANSETRON HCL (PF) 4 MG/2 ML INJECTION SOLUTION
INTRAMUSCULAR | Status: AC
Start: 2023-11-28 — End: 2023-11-28
  Filled 2023-11-28: qty 2

## 2023-11-28 MED ORDER — CLINDAMYCIN HCL 300 MG CAPSULE
300.0000 mg | ORAL_CAPSULE | Freq: Four times a day (QID) | ORAL | 0 refills | Status: AC
Start: 2023-11-28 — End: 2023-12-08

## 2023-11-28 MED ORDER — SODIUM CHLORIDE 0.9 % IV BOLUS
1000.0000 mL | INJECTION | Status: AC
Start: 2023-11-28 — End: 2023-11-28
  Administered 2023-11-28: 0 mL via INTRAVENOUS
  Administered 2023-11-28: 1000 mL via INTRAVENOUS

## 2023-11-28 MED ORDER — ONDANSETRON 4 MG DISINTEGRATING TABLET
4.0000 mg | ORAL_TABLET | Freq: Three times a day (TID) | ORAL | 0 refills | Status: AC | PRN
Start: 2023-11-28 — End: ?

## 2023-11-28 MED ORDER — CEFTRIAXONE 2 GRAM SOLUTION FOR INJECTION
INTRAMUSCULAR | Status: AC
Start: 2023-11-28 — End: 2023-11-28
  Filled 2023-11-28: qty 20

## 2023-11-28 MED ORDER — SODIUM CHLORIDE 0.9 % (FLUSH) INJECTION SYRINGE
3.0000 mL | INJECTION | INTRAMUSCULAR | Status: DC | PRN
Start: 2023-11-28 — End: 2023-11-28

## 2023-11-28 MED ORDER — SODIUM CHLORIDE 0.9 % INTRAVENOUS SOLUTION
INTRAVENOUS | Status: AC
Start: 2023-11-28 — End: 2023-11-28
  Filled 2023-11-28: qty 50

## 2023-11-28 MED ORDER — SODIUM CHLORIDE 0.9 % INTRAVENOUS SOLUTION
2.0000 g | INTRAVENOUS | Status: AC
Start: 2023-11-28 — End: 2023-11-28
  Administered 2023-11-28: 2 g via INTRAVENOUS
  Administered 2023-11-28: 0 g via INTRAVENOUS

## 2023-11-28 MED ORDER — MAGNESIUM OXIDE 400 MG (241.3 MG MAGNESIUM) TABLET
400.0000 mg | ORAL_TABLET | Freq: Two times a day (BID) | ORAL | 0 refills | Status: AC
Start: 2023-11-28 — End: ?

## 2023-11-28 NOTE — ED Nurses Note (Signed)
 Pt c/o's epigastric pain and nausea. Pain redness and swelling at first toe at right foot. Completed antibiotics still red and swollen.

## 2023-11-28 NOTE — ED Nurses Note (Signed)
 Prescription x 2 e-scribed. Verbal and written instructions given. Pt voices understanding. DC home ambulatory.

## 2023-11-28 NOTE — ED Provider Notes (Shared)
 [facility]  ED Primary Provider Note  Patient Name: Karen Gilbert  Patient Age: 43 y.o.  Date of Birth: December 13, 1980    Chief Complaint: Nausea (Patient states that she went to [place] for infected toe and was given antibiotic but it does not look any better.  Patient states she just does not feel well, tired, HA, nausea and chest tightness.  Also right foot is more swollen where the infection is.)        History of Present Illness       Karen Gilbert is a 43 y.o. female who had concerns including Nausea. ***        Review of Systems     No other overt Review of Systems are noted to be positive except noted in the HPI.    { Be sure to review and modify ROS as appropriate. As of May 30, 2021 you are only required to have a medically appropriate ROS and PE for billing purposes. This help text will disappear when signing your note.:123}  Historical Data   History Reviewed This Encounter: ***History has not been Marked as Reviewed - Go to the top of the ED Triage Tab to document your review. Make sure history sections are updated and accurate - Click Here to jump to History for update.***      Physical Exam   ED Triage Vitals [11/28/23 1500]   BP (Non-Invasive) (!) 149/99   Heart Rate 86   Respiratory Rate 18   Temperature 36.5 C (97.7 F)   SpO2 97 %   Weight 105 kg (232 lb)   Height 1.803 m (5' 11)     {Be sure to review and modify Physical Exam as appropriate. As of May 30, 2021 you are only required to have a medically appropriate ROS and PE for billing purposes. This help text will disappear when signing your note.:123}    ***          Procedures      Patient Data   {Click here to open the ED Workup Activity for clinical data review *This link will automatically disappear upon signing your note*:123}  Labs Ordered/Reviewed   COMPREHENSIVE METABOLIC PANEL, NON-FASTING - Abnormal; Notable for the following components:       Result Value    SODIUM 135 (*)     GLUCOSE 380 (*)      AST (SGOT) 11 (*)     All other components within normal limits    Narrative:     Estimated Glomerular Filtration Rate (eGFR) is calculated using the CKD-EPI (2021) equation, intended for patients 78 years of age and older. If gender is not documented or unknown, there will be no eGFR calculation.   LACTIC ACID LEVEL W/ REFLEX FOR LEVEL >2.0 - Abnormal; Notable for the following components:    LACTIC ACID 2.1 (*)     All other components within normal limits   MAGNESIUM - Abnormal; Notable for the following components:    MAGNESIUM 1.7 (*)     All other components within normal limits   BLOOD GAS W/ LACTATE REFLEX - Abnormal; Notable for the following components:    PH (VENOUS) 7.44 (*)     PCO2 (VENOUS) 39 (*)     All other components within normal limits    Narrative:     Manufacturer does not recommend venous sample for assessment of patient oxygenation status. A reference range for pO2, Oxyhemoglobin and Oxygen Saturation is provided  but abnormal results will not flag in Epic.   CBC WITH DIFF - Abnormal; Notable for the following components:    MPV 7.4 (*)     All other components within normal limits   URINALYSIS, MACRO/MICRO - Abnormal; Notable for the following components:    GLUCOSE >=1000 (*)     All other components within normal limits   RAPID THROAT SCREEN, STREPTOCOCCUS, WITH REFLEX - Normal    Narrative:     Walk-Away Mode   ACETONE - Normal   COVID-19, FLU A/B, RSV RAPID BY PCR - Normal    Narrative:     Results are for the simultaneous qualitative identification of SARS-CoV-2 (formerly 2019-nCoV), Influenza A, Influenza B, and RSV RNA. These etiologic agents are generally detectable in nasopharyngeal and nasal swabs during the ACUTE PHASE of infection. Hence, this test is intended to be performed on respiratory specimens collected from individuals with signs and symptoms of upper respiratory tract infection who meet Centers for Disease Control and Prevention (CDC) clinical and/or epidemiological  criteria for Coronavirus Disease 2019 (COVID-19) testing. CDC COVID-19 criteria for testing on human specimens is available at [facility] webpage information for Healthcare Professionals: Coronavirus Disease 2019 (COVID-19) ([email or web address]).     False-negative results may occur if the virus has genomic mutations, insertions, deletions, or rearrangements or if performed very early in the course of illness. Otherwise, negative results indicate virus specific RNA targets are not detected, however negative results do not preclude SARS-CoV-2 infection/COVID-19, Influenza, or Respiratory syncytial virus infection. Results should not be used as the sole basis for patient management decisions. Negative results must be combined with clinical observations, patient history, and epidemiological information. If upper respiratory tract infection is still suspected based on exposure history together with other clinical findings, re-testing should be considered.    Test methodology:   Cepheid Xpert Xpress SARS-CoV-2/Flu/RSV Assay real-time polymerase chain reaction (RT-PCR) test on the GeneXpert Dx and Xpert Xpress systems.   ADULT ROUTINE BLOOD CULTURE, SET OF 2 BOTTLES (BACTERIA AND YEAST)   ADULT ROUTINE BLOOD CULTURE, SET OF 2 BOTTLES (BACTERIA AND YEAST)   THROAT CULTURE, BETA HEMOLYTIC STREPTOCOCCUS   CBC/DIFF    Narrative:     The following orders were created for panel order CBC/DIFF.  Procedure                               Abnormality         Status                     ---------                               -----------         ------                     CBC WITH DIFF[[id number]]                Abnormal            Final result                 Please view results for these tests on the individual orders.   URINALYSIS WITH REFLEX MICROSCOPIC AND CULTURE IF POSITIVE    Narrative:     The following orders were created for panel order URINALYSIS WITH REFLEX MICROSCOPIC AND CULTURE IF  POSITIVE.  Procedure                               Abnormality         Status                     ---------                               -----------         ------                     URINALYSIS, MACRO/MICRO[[id number]]      Abnormal            Final result                 Please view results for these tests on the individual orders.   LACTIC ACID - FIRST REFLEX   PERFORM POC WHOLE BLOOD GLUCOSE   PERFORM POC WHOLE BLOOD GLUCOSE       XR FOOT RIGHT   Final Result by Edi, Radresults In (07/01 1531)   1. No acute osseous abnormality identified.   2. No radiopaque foreign body or soft tissue emphysema is seen.   3. Soft tissue swelling does appear to be present.               Radiologist location ID: [id number]             Medical Decision Making        {Be sure to fill out the MDM SmartBlock in Notewriter to the left. Do not modify this italicized text, it will disappear upon signing your note:123}  Medical Decision Making  Amount and/or Complexity of Data Reviewed  Labs: ordered.  Radiology: ordered.    Risk  Prescription drug management.            Studies Assessed: ***    EKG:   This independent EKG interpretation by that has noted below, this will be reviewed and documented separately by a cardiologist in the EMR.:    Rate: ***    Interpretation: ***      MDM Narrative:  ***        Differential includes but not limited to:  ***      Follow-Up Discussion: ***    Please see documentation above for specific labs and radiology.    Decision for and Complexity of Risk in the ED encounter:  ***I ordered prescription medications requiring authorization in the ED or at discharge.  ***I decided when to consult for hospitalization or escalation of hospital-level care.  ***I ordered Parenteral Controlled Substances  Social Determinants Contributing to Healthcare Difficulty: ***  Independent/Additional Historian: *** with information given about ***.  Discussion of care with a provider outside of the Emergency Department:  ***          A 74ml/kg fluid bolus not ordered due to ***.  Please see the EMR for the total amount ordered/administered.    Critical Care Attestation:  As the ED {provider type:48533}, I have provided critical care for this patient.  This patient has high probability of imminent life or limb threatening deterioration due to [facility] CRITICALLY POO:51471}.  I provided {INTERVENTIONS:48529::direct patient care,documentation of findings,review of medical records} and frequent reassessment.  Time spent providing critical care (exclusive of any billed procedures and/or teaching): ***  minutes.                            Medications Ordered/Administered in the ED   NS flush syringe (has no administration in time range)   NS flush syringe (has no administration in time range)   NS flush syringe (has no administration in time range)   NS flush syringe (has no administration in time range)   NS bolus infusion 1,000 mL (has no administration in time range)   insulin  lispro 100 units/mL injection (has no administration in time range)   NS bolus infusion 1,000 mL (0 mL Intravenous Stopped 11/28/23 1712)   ondansetron  (ZOFRAN ) 2 mg/mL injection (4 mg Intravenous Given 11/28/23 1615)   cefTRIAXone (ROCEPHIN) 2 g in NS 50 mL IVPB with adaptor (0 g Intravenous Stopped 11/28/23 1739)       {Disposition:38159}             Risk as noted in the medical decision-making is in reference to potential morbidity/mortality of management based upon previously established billing guidelines.    Based upon the clinical setting, the likely diagnosis/impression include:    Clinical Impression   Cellulitis of right foot (Primary)   Infection of nail bed of toe of right foot - great toe   Hyperglycemia   Dehydration         Current Discharge Medication List        START taking these medications    Details   clindamycin (CLEOCIN) 300 mg Oral Capsule Take 1 Capsule (300 mg total) by mouth Four times a day for 10 days  Qty: 40 Capsule, Refills: 0      !!  ondansetron  (ZOFRAN  ODT) 4 mg Oral Tablet, Rapid Dissolve Take 1 Tablet (4 mg total) by mouth Every 8 hours as needed for Nausea/Vomiting  Qty: 12 Tablet, Refills: 0       !! - Potential duplicate medications found. Please discuss with provider.                This note was partially created using voice recognition software and is inherently subject to errors including those of syntax and sound alike  substitutions which may escape proof reading. In such instances, original meaning may be extrapolated by contextual derivation.                      {Remember to refresh your note prior to signing. Use Control + F11 or click the refresh button at the bottom of the note. This reminder text will automatically disappear when you sign your note.:123}

## 2023-12-01 LAB — THROAT CULTURE, BETA HEMOLYTIC STREPTOCOCCUS: THROAT CULTURE: NORMAL

## 2023-12-03 LAB — ADULT ROUTINE BLOOD CULTURE, SET OF 2 BOTTLES (BACTERIA AND YEAST)
BLOOD CULTURE, ROUTINE: NO GROWTH
BLOOD CULTURE, ROUTINE: NO GROWTH

## 2023-12-12 NOTE — ED Provider Notes (Signed)
 [facility], [place] - Emergency Department  ED Primary Note  History of Present Illness   Karen Gilbert is a 43 y.o. female who had concerns including Nausea. [place] [facility] [place]  hx f dm. States feeling tired and nauseated ha. General illness states unsure if foot infection is causing this or not   Review of Systems   Constitutional: No fever, chills +weakness   Skin: No rash or diaphoresis+ redness.   HENT: No headaches, or congestion  Eyes: No vision changes or photophobia   Cardio: No chest pain, palpitations or leg swelling   Respiratory: No cough, wheezing or SOB  GI:  + nausea, no vomiting or stool changes  GU:  No dysuria, hematuria, or increased frequency  MSK: No muscle aches, joint or back pain  Neuro: No seizures, LOC, numbness, tingling, or focal weakness  Psychiatric: No depression, SI or substance abuse  All other systems reviewed and are negative.      Physical Exam   ED Triage Vitals [11/28/23 1500]   BP (Non-Invasive) (!) 149/99   Heart Rate 86   Respiratory Rate 18   Temperature 36.5 C (97.7 F)   SpO2 97 %   Weight 105 kg (232 lb)   Height 1.803 m (5' 11)     Constitutional:  43 y.o. female who appears in no distress. Normal color, no cyanosis.   HENT:   Head: Normocephalic and atraumatic.   Mouth/Throat: Oropharynx is clear and moist.   Eyes: EOMI, PERRL   Neck: Trachea midline. Neck supple.  Cardiovascular: RRR, No murmurs, rubs or gallops. Intact distal pulses.  Pulmonary/Chest: BS equal bilaterally. No respiratory distress. No wheezes, rales or chest tenderness.   Abdominal: Bowel sounds present and normal. Abdomen soft, mild epigastric tenderness, no rebound and no guarding.  Back: No midline spinal tenderness, no paraspinal tenderness, no CVA tenderness.           Musculoskeletal: + rt great toe  edema, tenderness redness no  deformity.  Skin: warm and dry. No rash,+ mod erythema and dorsal edema. Rt foot  no pallor or  cyanosis  Psychiatric: normal mood and affect. Behavior is normal.   Neurological: Patient keenly alert and responsive, easily able to raise eyebrows, facial muscles/expressions symmetric, speaking in fluent sentences, moving all extremities equally and fully, normal gait  Patient Data   Labs Ordered/Reviewed   COMPREHENSIVE METABOLIC PANEL, NON-FASTING - Abnormal; Notable for the following components:       Result Value    SODIUM 135 (*)     GLUCOSE 380 (*)     AST (SGOT) 11 (*)     All other components within normal limits    Narrative:     Estimated Glomerular Filtration Rate (eGFR) is calculated using the CKD-EPI (2021) equation, intended for patients 45 years of age and older. If gender is not documented or unknown, there will be no eGFR calculation.   LACTIC ACID LEVEL W/ REFLEX FOR LEVEL >2.0 - Abnormal; Notable for the following components:    LACTIC ACID 2.1 (*)     All other components within normal limits   MAGNESIUM  - Abnormal; Notable for the following components:    MAGNESIUM  1.7 (*)     All other components within normal limits   BLOOD GAS W/ LACTATE REFLEX - Abnormal; Notable for the following components:    PH (VENOUS) 7.44 (*)     PCO2 (VENOUS) 39 (*)  All other components within normal limits    Narrative:     Manufacturer does not recommend venous sample for assessment of patient oxygenation status. A reference range for pO2, Oxyhemoglobin and Oxygen Saturation is provided but abnormal results will not flag in Epic.   CBC WITH DIFF - Abnormal; Notable for the following components:    MPV 7.4 (*)     All other components within normal limits   URINALYSIS, MACRO/MICRO - Abnormal; Notable for the following components:    GLUCOSE >=1000 (*)     All other components within normal limits   RAPID THROAT SCREEN, STREPTOCOCCUS, WITH REFLEX - Normal    Narrative:     Walk-Away Mode   ACETONE - Normal   COVID-19, FLU A/B, RSV RAPID BY PCR - Normal    Narrative:     Results are for the simultaneous  qualitative identification of SARS-CoV-2 (formerly 2019-nCoV), Influenza A, Influenza B, and RSV RNA. These etiologic agents are generally detectable in nasopharyngeal and nasal swabs during the ACUTE PHASE of infection. Hence, this test is intended to be performed on respiratory specimens collected from individuals with signs and symptoms of upper respiratory tract infection who meet Centers for Disease Control and Prevention (CDC) clinical and/or epidemiological criteria for Coronavirus Disease 2019 (COVID-19) testing. CDC COVID-19 criteria for testing on human specimens is available at [facility] webpage information for Healthcare Professionals: Coronavirus Disease 2019 (COVID-19) ([email or web address]).     False-negative results may occur if the virus has genomic mutations, insertions, deletions, or rearrangements or if performed very early in the course of illness. Otherwise, negative results indicate virus specific RNA targets are not detected, however negative results do not preclude SARS-CoV-2 infection/COVID-19, Influenza, or Respiratory syncytial virus infection. Results should not be used as the sole basis for patient management decisions. Negative results must be combined with clinical observations, patient history, and epidemiological information. If upper respiratory tract infection is still suspected based on exposure history together with other clinical findings, re-testing should be considered.    Test methodology:   Cepheid Xpert Xpress SARS-CoV-2/Flu/RSV Assay real-time polymerase chain reaction (RT-PCR) test on the GeneXpert Dx and Xpert Xpress systems.   CBC/DIFF    Narrative:     The following orders were created for panel order CBC/DIFF.  Procedure                               Abnormality         Status                     ---------                               -----------         ------                     CBC WITH DIFF[[id number]]                Abnormal             Final result                 Please view results for these tests on the individual orders.   URINALYSIS WITH REFLEX MICROSCOPIC AND CULTURE IF POSITIVE    Narrative:     The following orders were created for panel order URINALYSIS WITH  REFLEX MICROSCOPIC AND CULTURE IF POSITIVE.  Procedure                               Abnormality         Status                     ---------                               -----------         ------                     URINALYSIS, MACRO/MICRO[[id number]]      Abnormal            Final result                 Please view results for these tests on the individual orders.     XR FOOT RIGHT   Final Result by Edi, Radresults In (07/01 1531)   1. No acute osseous abnormality identified.   2. No radiopaque foreign body or soft tissue emphysema is seen.   3. Soft tissue swelling does appear to be present.               Radiologist location ID: [id number]           Medical Decision Making   Diff dx  dka cellulitis sepsis. Side effects of meds. No signs of dka. Mag 1.7 lacti c2.1 wbc 8. 3 gluc 380. Sx were relieved pt reports feeling better started on antibiotics. No nv in er .tolerated po fluids .epigastric pain resolved.             Medications Ordered/Administered in the ED   NS bolus infusion 1,000 mL (0 mL Intravenous Stopped 11/28/23 1712)   NS bolus infusion 1,000 mL (0 mL Intravenous Stopped 11/28/23 1730)   ondansetron  (ZOFRAN ) 2 mg/mL injection (4 mg Intravenous Given 11/28/23 1615)   cefTRIAXone  (ROCEPHIN ) 2 g in NS 50 mL IVPB with adaptor (0 g Intravenous Stopped 11/28/23 1739)     Clinical Impression   Cellulitis of right foot (Primary)   Infection of nail bed of toe of right foot - great toe   Hyperglycemia   Dehydration       Disposition: Discharged

## 2023-12-19 ENCOUNTER — Emergency Department (HOSPITAL_COMMUNITY)

## 2023-12-19 ENCOUNTER — Encounter (HOSPITAL_BASED_OUTPATIENT_CLINIC_OR_DEPARTMENT_OTHER): Payer: Self-pay

## 2023-12-19 ENCOUNTER — Emergency Department
Admission: EM | Admit: 2023-12-19 | Discharge: 2023-12-19 | Disposition: A | Discharge status: Home / Self Care | Attending: Emergency Medicine | Admitting: Emergency Medicine

## 2023-12-19 ENCOUNTER — Other Ambulatory Visit: Payer: Self-pay

## 2023-12-19 DIAGNOSIS — M79669 Pain in unspecified lower leg: Secondary | ICD-10-CM

## 2023-12-19 DIAGNOSIS — M79661 Pain in right lower leg: Secondary | ICD-10-CM | POA: Insufficient documentation

## 2023-12-19 DIAGNOSIS — M7989 Other specified soft tissue disorders: Secondary | ICD-10-CM | POA: Insufficient documentation

## 2023-12-19 DIAGNOSIS — L819 Disorder of pigmentation, unspecified: Secondary | ICD-10-CM

## 2023-12-19 LAB — COMPREHENSIVE METABOLIC PANEL, NON-FASTING
ALBUMIN/GLOBULIN RATIO: 0.9 (ref 0.8–1.4)
ALBUMIN: 3.6 g/dL (ref 3.4–5.0)
ALKALINE PHOSPHATASE: 94 U/L (ref 46–116)
ALT (SGPT): 27 U/L (ref <=78)
ANION GAP: 12 mmol/L (ref 4–13)
AST (SGOT): 8 U/L — ABNORMAL LOW (ref 15–37)
BILIRUBIN TOTAL: 0.5 mg/dL (ref 0.2–1.0)
BUN/CREA RATIO: 12
BUN: 9 mg/dL (ref 7–18)
CALCIUM, CORRECTED: 9.4 mg/dL
CALCIUM: 9.1 mg/dL (ref 8.5–10.1)
CHLORIDE: 100 mmol/L (ref 98–107)
CO2 TOTAL: 26 mmol/L (ref 21–32)
CREATININE: 0.78 mg/dL (ref 0.55–1.02)
ESTIMATED GFR: 95 mL/min/1.73mˆ2 (ref >59)
GLOBULIN: 4.1
GLUCOSE: 318 mg/dL — ABNORMAL HIGH (ref 74–106)
OSMOLALITY, CALCULATED: 287 mosm/kg (ref 270–290)
POTASSIUM: 4.3 mmol/L (ref 3.5–5.1)
PROTEIN TOTAL: 7.7 g/dL (ref 6.4–8.2)
SODIUM: 138 mmol/L (ref 136–145)

## 2023-12-19 LAB — CBC WITH DIFF
BASOPHIL #: 0.02 x10ˆ3/uL (ref 0.00–0.10)
BASOPHIL %: 0 % (ref 0–1)
EOSINOPHIL #: 0.35 x10ˆ3/uL (ref 0.00–0.50)
EOSINOPHIL %: 4 % (ref 1–7)
HCT: 39.5 % (ref 31.2–41.9)
HGB: 13.1 g/dL (ref 10.9–14.3)
LYMPHOCYTE #: 2.91 x10ˆ3/uL (ref 1.10–3.10)
LYMPHOCYTE %: 31 % (ref 16–46)
MCH: 27.9 pg (ref 24.7–32.8)
MCHC: 33.1 g/dL (ref 32.3–35.6)
MCV: 84.3 fL (ref 75.5–95.3)
MONOCYTE #: 0.6 x10ˆ3/uL (ref 0.20–0.90)
MONOCYTE %: 6 % (ref 4–11)
MPV: 7.2 fL — ABNORMAL LOW (ref 7.9–10.8)
NEUTROPHIL #: 5.54 x10ˆ3/uL (ref 1.90–8.20)
NEUTROPHIL %: 59 % (ref 43–77)
PLATELETS: 357 x10ˆ3/uL (ref 140–440)
RBC: 4.69 x10ˆ6/uL (ref 3.63–4.92)
RDW: 16.3 % (ref 12.3–17.7)
WBC: 9.4 x10ˆ3/uL (ref 3.8–11.8)

## 2023-12-19 LAB — PTT (PARTIAL THROMBOPLASTIN TIME): APTT: 29.5 s (ref 25.0–38.0)

## 2023-12-19 LAB — PT/INR
INR: 0.93 (ref 0.84–1.10)
PROTHROMBIN TIME: 10.5 s (ref 9.8–12.7)

## 2023-12-19 LAB — D-DIMER: D-DIMER: 405 ng{FEU}/mL (ref 215–500)

## 2023-12-19 NOTE — ED Provider Notes (Signed)
 Emergency Medicine      Name: Karen Gilbert  Age and Gender: 43 y.o. female  Date of Birth: Aug 19, 1980  MRN: [id number]  PCP: Lucie Buerger, FNP    CC:  Chief Complaint   Patient presents with    Leg Injury     Patient reports right toe infection that started one month has lead to right leg swelling and pain with bruising. Reports being on multiple antibiotics r/t to this infection. Bruising occurred spontaneously          HPI:  Karen Gilbert is a 43 y.o. White female who presents to the ER with reports of a right toe infection that has been ongoing for approximately a month.  Patient reports that she has had increased swelling in her leg with bruising to the calf and pain.  Patient denies any known injuries.  Patient reports that she has been on numerous antibiotics for this toe infection.  Patient denies chest pain, shortness of breath, nausea, vomiting or other complaints.    Below pertinent information reviewed with patient:  Past Medical History:   Diagnosis Date    Arthritis     Asthma     Diabetes mellitus type II     Fatty liver     HTN (hypertension)     Hyperlipidemia     Migraine     Myocardial infarction     Sjogren's syndrome      Allergies[1]    Past Surgical History:   Procedure Laterality Date    CYSTOSCOPY N/A 10/07/2021    Performed by Adeline Penne HERO, DO at [facility]    HX CHOLECYSTECTOMY      HX HYSTERECTOMY      HX SHOULDER ARTHROSCOPY      HX TONSILLECTOMY      HX TUBAL LIGATION      LAPAROSCOPIC ASSISTED VAGINAL NOTES HYSTERECTOMY; BILATERAL SALPINGECTOMY; LEFT OOPHORECTOMY N/A 10/07/2021    Performed by Adeline Penne HERO, DO at [facility]        Social History     Socioeconomic History    Marital status: Legally Separated   Occupational History     Employer: [employer]   Tobacco Use    Smoking status: Never     Passive exposure: Current    Smokeless tobacco: Never   Vaping Use    Vaping status: Never Used   Substance and Sexual Activity    Alcohol use: Yes      Comment: occasionaly    Drug use: Never     Social Determinants of Health     Financial Resource Strain: High Risk (01/26/2022)    Financial Resource Strain     SDOH Financial: Yes, multiple.   Transportation Needs: High Risk (01/26/2022)    Transportation Needs     SDOH Transportation: Yes, it has kept me from medical appointments or from getting my medications   Social Connections: Medium Risk (01/26/2022)    Social Connections     SDOH Social Isolation: 1 or 2 times a week   Housing Stability: Low Risk  (01/26/2022)    Housing Stability     SDOH Housing Situation: I have housing.     SDOH Housing Worry: No       ROS:  No other overt positive review of systems are noted other than stated in the HPI.      Objective:    ED Triage Vitals [12/19/23 1254]   BP (Non-Invasive) (!) 188/113   Heart  Rate 93   Respiratory Rate 20   Temperature 37 C (98.6 F)   SpO2 93 %   Weight 101 kg (223 lb 4.8 oz)   Height 1.803 m (5' 11)     Filed Vitals:    12/19/23 1254   BP: (!) 188/113   Pulse: 93   Resp: 20   Temp: 37 C (98.6 F)   SpO2: 93%       Nursing notes and vital signs reviewed.    Constitutional - No acute distress.  Alert and Active.  HEENT - Normocephalic. Atraumatic. PERRL. EOMI. Conjunctiva clear. TM's pearly grey, translucent, without bulging or retraction. Oropharynx with no erythema, lesions, or exudates. Moist mucous membranes.   Neck - Trachea midline. No stridor. No hoarseness.  Cardiac - Regular rate and rhythm. No murmurs, rubs, or gallops. Intact distal pulses.  Respiratory/Chest - Normal respiratory effort. Clear to auscultation bilaterally. No rales, wheezes or rhonchi. No chest tenderness.  Abdomen - Normal bowel sounds. Non-tender, soft, non-distended. No rebound or guarding.   Musculoskeletal - Good AROM. No muscle or joint tenderness appreciated. No clubbing, cyanosis or edema.  Skin - Warm and dry, without any rashes or other lesions.  Neuro - Alert and oriented x 3. Cranial nerves II-XII are grossly  intact.  Moving all extremities symmetrically. Normal gait.  Psych - Normal mood and affect. Behavior is normal         Any pertinent labs and imaging obtained during this encounter reviewed below in MDM.  No results found for this visit on 12/19/23 (from the past 720 hours).      MDM/ED Course:  Patient presents to the ER with reports of a right toe infection that has been ongoing for approximately a month.  Patient reports that she has had increased swelling in her leg with bruising to the calf and pain.  Patient denies any known injuries.  Patient reports that she has been on numerous antibiotics for this toe infection.  Patient denies chest pain, shortness of breath, nausea, vomiting or other complaints.    Patient underwent diagnostics with results as noted in ED course.  Lab work was ordered on the patient as well as a D-dimer.  Patient's CBC and CMP were found to be unremarkable.  The D-dimer was unable to be completed due to problems with the analyzer in the lab.  A CT scan of the right lower extremity with IV contrast was ordered on the patient, however this was unable to be completed due to the CT machine being down.  I did contact Dr. Jentilet at [facility] ER for transfer of the patient due to imaging requirements and he accepted the patient.  Patient is in agreeance with this plan. Patient will be transferred there via POV.      Patient was found/suspected to have pain and swelling of the right lower leg with discoloration.    Patient will be transferred to [facility] ER.    Medical Decision Making  Amount and/or Complexity of Data Reviewed  Labs: ordered.  ECG/medicine tests: independent interpretation performed.    Risk  Decision regarding hospitalization.      ED Course as of 12/19/23 1413   Tue Dec 19, 2023   1339 CBC/DIFF(!)  Unremarkable   1353 COMPREHENSIVE METABOLIC PANEL, NON-FASTING(!)  Unremarkable       Orders Placed This Encounter    CANCELED: CT ANGIO LOWER EXTREMITY  RIGHT W IV CONTRAST    CBC/DIFF    COMPREHENSIVE  METABOLIC PANEL, NON-FASTING    PT/INR    PTT (PARTIAL THROMBOPLASTIN TIME)    D-DIMER    CBC WITH DIFF    PERIPHERAL VENOUS DUPLEX - LOWER       Impression:   Clinical Impression   Pain and swelling of lower leg, right   Discoloration of skin of lower leg (Primary)       Disposition: Transfered to Another Facility      Portions of this note may have been dictated using voice recognition software.     -----------------------  Results for orders placed or performed during the hospital encounter of 12/19/23 (from the past 12 hours)   COMPREHENSIVE METABOLIC PANEL, NON-FASTING   Result Value Ref Range    SODIUM 138 136 - 145 mmol/L    POTASSIUM 4.3 3.5 - 5.1 mmol/L    CHLORIDE 100 98 - 107 mmol/L    CO2 TOTAL 26 21 - 32 mmol/L    ANION GAP 12 4 - 13 mmol/L    BUN 9 7 - 18 mg/dL    CREATININE 9.21 9.44 - 1.02 mg/dL    BUN/CREA RATIO 12     ESTIMATED GFR 95 >59 mL/min/1.53m^2    ALBUMIN 3.6 3.4 - 5.0 g/dL    CALCIUM 9.1 8.5 - 89.8 mg/dL    GLUCOSE 681 (H) 74 - 106 mg/dL    ALKALINE PHOSPHATASE 94 46 - 116 U/L    ALT (SGPT) 27 <=78 U/L    AST (SGOT) 8 (L) 15 - 37 U/L    BILIRUBIN TOTAL 0.5 0.2 - 1.0 mg/dL    PROTEIN TOTAL 7.7 6.4 - 8.2 g/dL    ALBUMIN/GLOBULIN RATIO 0.9 0.8 - 1.4    OSMOLALITY, CALCULATED 287 270 - 290 mOsm/kg    CALCIUM, CORRECTED 9.4 mg/dL    GLOBULIN 4.1    CBC WITH DIFF   Result Value Ref Range    WBC 9.4 3.8 - 11.8 x10^3/uL    RBC 4.69 3.63 - 4.92 x10^6/uL    HGB 13.1 10.9 - 14.3 g/dL    HCT 60.4 68.7 - 58.0 %    MCV 84.3 75.5 - 95.3 fL    MCH 27.9 24.7 - 32.8 pg    MCHC 33.1 32.3 - 35.6 g/dL    RDW 83.6 87.6 - 82.2 %    PLATELETS 357 140 - 440 x10^3/uL    MPV 7.2 (L) 7.9 - 10.8 fL    NEUTROPHIL % 59 43 - 77 %    LYMPHOCYTE % 31 16 - 46 %    MONOCYTE % 6 4 - 11 %    EOSINOPHIL % 4 1 - 7 %    BASOPHIL % 0 0 - 1 %    NEUTROPHIL # 5.54 1.90 - 8.20 x10^3/uL    LYMPHOCYTE # 2.91 1.10 - 3.10 x10^3/uL    MONOCYTE # 0.60 0.20 - 0.90 x10^3/uL    EOSINOPHIL #  0.35 0.00 - 0.50 x10^3/uL    BASOPHIL # 0.02 0.00 - 0.10 x10^3/uL     No orders to display            [1]   Allergies  Allergen Reactions    Codeine Itching    Naproxen Swelling    Motrin [Ibuprofen] Swelling     Swelling in legs

## 2023-12-19 NOTE — ED Nurses Note (Signed)
 Patient left ED at approx this time to go POV to [facility] ED for ultrasound. Pt informed of importance of going straight there for testing. Verbalized understanding. VSS. Left ED via POV. Report called to Jenna at [facility] ED.

## 2024-03-12 ENCOUNTER — Other Ambulatory Visit (HOSPITAL_COMMUNITY): Payer: Self-pay | Admitting: NURSE PRACTITIONER

## 2024-03-12 ENCOUNTER — Ambulatory Visit

## 2024-03-12 ENCOUNTER — Other Ambulatory Visit: Payer: Self-pay

## 2024-03-12 DIAGNOSIS — E119 Type 2 diabetes mellitus without complications: Secondary | ICD-10-CM | POA: Insufficient documentation

## 2024-03-12 DIAGNOSIS — Z1231 Encounter for screening mammogram for malignant neoplasm of breast: Secondary | ICD-10-CM

## 2024-03-12 LAB — COMPREHENSIVE METABOLIC PANEL, NON-FASTING
ALBUMIN/GLOBULIN RATIO: 0.9 (ref 0.8–1.4)
ALBUMIN: 3.6 g/dL (ref 3.4–5.0)
ALKALINE PHOSPHATASE: 91 U/L (ref 46–116)
ALT (SGPT): 19 U/L (ref <=78)
ANION GAP: 9 mmol/L (ref 4–13)
AST (SGOT): 8 U/L — ABNORMAL LOW (ref 15–37)
BILIRUBIN TOTAL: 0.3 mg/dL (ref 0.2–1.0)
BUN/CREA RATIO: 10
BUN: 9 mg/dL (ref 7–18)
CALCIUM, CORRECTED: 9.2 mg/dL
CALCIUM: 8.9 mg/dL (ref 8.5–10.1)
CHLORIDE: 96 mmol/L — ABNORMAL LOW (ref 98–107)
CO2 TOTAL: 26 mmol/L (ref 21–32)
CREATININE: 0.88 mg/dL (ref 0.55–1.02)
ESTIMATED GFR: 83 mL/min/1.73mˆ2 (ref >59)
GLOBULIN: 4
GLUCOSE: 389 mg/dL — ABNORMAL HIGH (ref 74–106)
OSMOLALITY, CALCULATED: 278 mosm/kg (ref 270–290)
POTASSIUM: 3.9 mmol/L (ref 3.5–5.1)
PROTEIN TOTAL: 7.6 g/dL (ref 6.4–8.2)
SODIUM: 131 mmol/L — ABNORMAL LOW (ref 136–145)

## 2024-03-12 LAB — MICROALBUMIN/CREATININE RATIO, URINE, RANDOM
CREATININE RANDOM URINE: 41 mg/dL (ref 30–125)
MICROALBUMIN RANDOM URINE: 1.2 mg/dL
MICROALBUMIN/CREATININE RATIO RANDOM URINE: 29.3 mg/g

## 2024-03-12 LAB — HGA1C (HEMOGLOBIN A1C WITH EST AVG GLUCOSE): HEMOGLOBIN A1C: 12.5 % — ABNORMAL HIGH (ref 4.0–6.0)

## 2024-03-27 ENCOUNTER — Emergency Department: Admission: EM | Admit: 2024-03-27 | Discharge: 2024-03-27 | Disposition: A | Discharge status: Home / Self Care

## 2024-03-27 ENCOUNTER — Other Ambulatory Visit: Payer: Self-pay

## 2024-03-27 ENCOUNTER — Encounter (HOSPITAL_BASED_OUTPATIENT_CLINIC_OR_DEPARTMENT_OTHER): Payer: Self-pay

## 2024-03-27 DIAGNOSIS — R739 Hyperglycemia, unspecified: Secondary | ICD-10-CM

## 2024-03-27 DIAGNOSIS — I10 Essential (primary) hypertension: Secondary | ICD-10-CM

## 2024-03-27 DIAGNOSIS — M79604 Pain in right leg: Secondary | ICD-10-CM

## 2024-03-27 LAB — MANUAL DIFFERENTIAL
BASOPHIL %: 2 % (ref 0–3)
BASOPHIL ABSOLUTE: 0.15 x10ˆ3/uL (ref 0.00–0.30)
BASOPHILS MANUAL: 2
EOSINOPHIL %: 4 % (ref 0–7)
EOSINOPHIL ABSOLUTE: 0.3 x10ˆ3/uL (ref 0.00–0.80)
EOSINOPHILS MANUAL: 4
LYMPHOCYTE %: 26 % (ref 25–45)
LYMPHOCYTE ABSOLUTE: 1.95 x10ˆ3/uL (ref 1.10–5.00)
LYMPHOCYTES MANUAL: 26
MONOCYTE %: 2 % (ref 0–12)
MONOCYTE ABSOLUTE: 0.15 x10ˆ3/uL (ref 0.00–0.30)
MONOCYTES MANUAL: 2
NEUTROPHIL %: 66 % (ref 40–76)
NEUTROPHIL ABSOLUTE: 4.95 x10ˆ3/uL (ref 1.80–8.40)
NEUTROPHILS MANUAL: 66
PLATELET MORPHOLOGY COMMENT: NORMAL
RBC MORPHOLOGY COMMENT: NORMAL
SCHISTOCYTES: ABSENT
TOTAL CELLS COUNTED [#] IN BLOOD: 100
WBC: 7.5 x10ˆ3/uL

## 2024-03-27 LAB — D-DIMER: D-DIMER: 303 ng{FEU}/mL (ref 215–500)

## 2024-03-27 LAB — COMPREHENSIVE METABOLIC PANEL, NON-FASTING
ALBUMIN/GLOBULIN RATIO: 0.9 (ref 0.8–1.4)
ALBUMIN: 3.4 g/dL (ref 3.4–5.0)
ALKALINE PHOSPHATASE: 79 U/L (ref 46–116)
ALT (SGPT): 24 U/L (ref <=78)
ANION GAP: 10 mmol/L (ref 4–13)
AST (SGOT): 10 U/L — ABNORMAL LOW (ref 15–37)
BILIRUBIN TOTAL: 0.1 mg/dL — ABNORMAL LOW (ref 0.2–1.0)
BUN/CREA RATIO: 9
BUN: 7 mg/dL (ref 7–18)
CALCIUM, CORRECTED: 9.1 mg/dL
CALCIUM: 8.6 mg/dL (ref 8.5–10.1)
CHLORIDE: 100 mmol/L (ref 98–107)
CO2 TOTAL: 27 mmol/L (ref 21–32)
CREATININE: 0.8 mg/dL (ref 0.55–1.02)
ESTIMATED GFR: 93 mL/min/1.73mˆ2 (ref >59)
GLOBULIN: 3.9
GLUCOSE: 349 mg/dL — ABNORMAL HIGH (ref 74–106)
OSMOLALITY, CALCULATED: 286 mosm/kg (ref 270–290)
POTASSIUM: 4 mmol/L (ref 3.5–5.1)
PROTEIN TOTAL: 7.3 g/dL (ref 6.4–8.2)
SODIUM: 137 mmol/L (ref 136–145)

## 2024-03-27 LAB — CBC WITH DIFF
HCT: 35.4 % (ref 31.2–41.9)
HGB: 11.9 g/dL (ref 10.9–14.3)
MCH: 28.4 pg (ref 24.7–32.8)
MCHC: 33.7 g/dL (ref 32.3–35.6)
MCV: 84.2 fL (ref 75.5–95.3)
MPV: 7.4 fL — ABNORMAL LOW (ref 7.9–10.8)
PLATELETS: 318 x10ˆ3/uL (ref 140–440)
RBC: 4.2 x10ˆ6/uL (ref 3.63–4.92)
RDW: 16.1 % (ref 12.3–17.7)
WBC: 7.5 x10ˆ3/uL (ref 3.8–11.8)

## 2024-03-27 MED ORDER — INSULIN LISPRO 100 UNIT/ML SUB-Q - CHARGE BY DOSE
5.0000 [IU] | Freq: Once | SUBCUTANEOUS | Status: AC
Start: 2024-03-27 — End: 2024-03-27
  Administered 2024-03-27: 5 [IU] via SUBCUTANEOUS

## 2024-03-27 MED ORDER — TRAMADOL 37.5 MG-ACETAMINOPHEN 325 MG TABLET
1.0000 | ORAL_TABLET | Freq: Four times a day (QID) | ORAL | 0 refills | Status: AC | PRN
Start: 2024-03-27 — End: ?

## 2024-03-27 MED ORDER — HYDROCHLOROTHIAZIDE 25 MG TABLET
25.0000 mg | ORAL_TABLET | ORAL | Status: AC
Start: 2024-03-27 — End: 2024-03-27
  Administered 2024-03-27: 25 mg via ORAL

## 2024-03-27 MED ORDER — HYDROCHLOROTHIAZIDE 25 MG TABLET
ORAL_TABLET | ORAL | Status: AC
Start: 2024-03-27 — End: 2024-03-27
  Filled 2024-03-27: qty 1

## 2024-03-27 MED ORDER — INSULIN LISPRO 100 UNIT/ML SUB-Q - CHARGE BY DOSE
SUBCUTANEOUS | Status: AC
Start: 2024-03-27 — End: 2024-03-27
  Filled 2024-03-27: qty 5

## 2024-03-27 MED ORDER — ASPIRIN 81 MG TABLET,DELAYED RELEASE
81.0000 mg | DELAYED_RELEASE_TABLET | Freq: Every day | ORAL | 0 refills | Status: AC
Start: 2024-03-27 — End: 2024-04-10

## 2024-03-27 NOTE — Discharge Instructions (Addendum)
 Please call scheduling [phone] ask for them to set up testing  that you have orders for. Return if any concerns. Elevation leg. Tight control of blood sugar and blood pressures [phone] is an alternative number to schedule appt.

## 2024-03-27 NOTE — ED Provider Notes (Signed)
 [facility], [place] - Emergency Department  ED Primary Note  History of Present Illness   Cydnie Deason is a 43 y.o. female who had concerns including Leg Pain.pt states rt leg has been swelling for several weeks to months. States she has had increase  in her lastix wo relief toes tingle hx of neuropathy no known hx of clots .  Review of Systems   Constitutional: No fever, chills or weakness   Skin: No rash or diaphoresis  HENT: No headaches, or congestion  Eyes: No vision changes or photophobia   Cardio: No chest pain, palpitations or leg swelling   Respiratory: No cough, wheezing or SOB  GI:  No nausea, vomiting or stool changes  GU:  No dysuria, hematuria, or increased frequency  MSK: No muscle aches, joint or back pain  Neuro: No seizures, LOC, numbness, tingling, or focal weakness  Psychiatric: No depression, SI or substance abuse  All other systems reviewed and are negative.      Physical Exam   ED Triage Vitals [03/27/24 1211]   BP (Non-Invasive) (!) 200/93   Heart Rate 79   Respiratory Rate 20   Temperature 36.7 C (98.1 F)   SpO2 96 %   Weight 101 kg (223 lb)   Height 1.803 m (5' 11)     Constitutional:  43 y.o. female who appears in no distress. Normal color, no cyanosis.   HENT:   Head: Normocephalic and atraumatic.   Mouth/Throat: Oropharynx is clear and moist.   Eyes: EOMI, PERRL   Neck: Trachea midline. Neck supple.  Cardiovascular: RRR, No murmurs, rubs or gallops. Intact distal pulses.  Pulmonary/Chest: BS equal bilaterally. No respiratory distress. No wheezes, rales or chest tenderness.   Abdominal: Bowel sounds present and normal. Abdomen soft, no tenderness, no rebound and no guarding.  Back: No midline spinal tenderness, no paraspinal tenderness, no CVA tenderness.           Musculoskeletal: No edema, tenderness or deformity.  Skin: warm and dry. No rash, erythema, pallor or cyanosis  Psychiatric: normal mood and affect. Behavior is normal.   Neurological: Patient  keenly alert and responsive, easily able to raise eyebrows, facial muscles/expressions symmetric, speaking in fluent sentences, moving all extremities equally and fully, normal gait  Patient Data   Labs Ordered/Reviewed   COMPREHENSIVE METABOLIC PANEL, NON-FASTING - Abnormal; Notable for the following components:       Result Value    GLUCOSE 349 (*)     AST (SGOT) 10 (*)     BILIRUBIN TOTAL 0.1 (*)     All other components within normal limits    Narrative:     Estimated Glomerular Filtration Rate (eGFR) is calculated using the CKD-EPI (2021) equation, intended for patients 34 years of age and older. If gender is not documented or unknown, there will be no eGFR calculation.   CBC WITH DIFF - Abnormal; Notable for the following components:    MPV 7.4 (*)     All other components within normal limits   D-DIMER - Normal    Narrative:     D-Dimers are reported in FEU per ng/mL.    IF PATIENT IS EXHIBITING SYMPTOMS DVT/PE, THIS D-DIMER RESULT MAY INDICATE A NEED FOR FURTHER TESTING FOR THESE CONDITIONS. IF PATIENT IS SUSPECTED OF DIC AND SYMPTOMS WORSEN OR PERSIST, A REPEAT DIC WORKUP SHOULD BE CONSIDERED.    NOTE: ALTHOUGH THE NORMAL RANGE FOR THIS TEST IS 215-500 ng/mL FEU, LITERATURE RECOMMENDS FURTHER TESTING FOR  ANY RESULT >500ng/mL FEU.    A cut off value of 500ng/mL FEU or below can be used as an aid in the diagnosis of Thromboembolism when used in conjunction with the patient's medical history, clinical presentation and other findings. Results of 500ng/mL FEU or below have a negative predictive value of 100%.     CBC/DIFF    Narrative:     The following orders were created for panel order CBC/DIFF.  Procedure                               Abnormality         Status                     ---------                               -----------         ------                     CBC WITH IPQQ[[id number]]                Abnormal            Final result               MANUAL DIFFERENTIAL[[id number]]                               Final result                 Please view results for these tests on the individual orders.   MANUAL DIFFERENTIAL     No orders to display     Medical Decision Making   Diff dx of pvd DVT. Pedal edema. Rt calf 40 cm left i s 35 ddimer is not elevated at 303. No painful flexion or calf tenderness. Guc is 349. Pt given out pt order for abi and venous us  rt leg  has had u. S of rt leg for similar co in July and was neg. Pt agrees to have close follow up return if cp sob painful breathing worse pain in leg color changes or any concerns          Medications Ordered/Administered in the ED   insulin  lispro 100 units/mL injection (has no administration in time range)   hydroCHLOROthiazide (HYDRODIURIL) tablet (25 mg Oral Given 03/27/24 1343)     Clinical Impression   Right leg pain (Primary)   Hyperglycemia   Hypertension, unspecified type       Disposition: Discharged

## 2024-03-27 NOTE — ED Nurses Note (Signed)
 Right calf 40 cm. Left calf 35 cm.

## 2024-03-27 NOTE — ED Nurses Note (Signed)

## 2024-03-28 ENCOUNTER — Ambulatory Visit (HOSPITAL_COMMUNITY): Payer: Self-pay

## 2024-03-29 ENCOUNTER — Ambulatory Visit (HOSPITAL_COMMUNITY)

## 2024-03-29 ENCOUNTER — Ambulatory Visit (HOSPITAL_COMMUNITY): Payer: Self-pay

## 2024-03-30 ENCOUNTER — Encounter (HOSPITAL_BASED_OUTPATIENT_CLINIC_OR_DEPARTMENT_OTHER): Payer: Self-pay

## 2024-03-30 ENCOUNTER — Emergency Department (HOSPITAL_BASED_OUTPATIENT_CLINIC_OR_DEPARTMENT_OTHER)

## 2024-03-30 ENCOUNTER — Other Ambulatory Visit: Payer: Self-pay

## 2024-03-30 ENCOUNTER — Emergency Department
Admission: EM | Admit: 2024-03-30 | Discharge: 2024-03-30 | Disposition: A | Discharge status: Home / Self Care | Attending: Family | Admitting: Family

## 2024-03-30 DIAGNOSIS — M25532 Pain in left wrist: Secondary | ICD-10-CM

## 2024-03-30 DIAGNOSIS — S63502A Unspecified sprain of left wrist, initial encounter: Secondary | ICD-10-CM | POA: Insufficient documentation

## 2024-03-30 DIAGNOSIS — S46912A Strain of unspecified muscle, fascia and tendon at shoulder and upper arm level, left arm, initial encounter: Secondary | ICD-10-CM | POA: Insufficient documentation

## 2024-03-30 DIAGNOSIS — X500XXA Overexertion from strenuous movement or load, initial encounter: Secondary | ICD-10-CM

## 2024-03-30 DIAGNOSIS — I10 Essential (primary) hypertension: Secondary | ICD-10-CM | POA: Insufficient documentation

## 2024-03-30 DIAGNOSIS — X509XXA Other and unspecified overexertion or strenuous movements or postures, initial encounter: Secondary | ICD-10-CM | POA: Insufficient documentation

## 2024-03-30 DIAGNOSIS — M25522 Pain in left elbow: Secondary | ICD-10-CM

## 2024-03-30 DIAGNOSIS — M25512 Pain in left shoulder: Secondary | ICD-10-CM

## 2024-03-30 DIAGNOSIS — M7989 Other specified soft tissue disorders: Secondary | ICD-10-CM

## 2024-03-30 MED ORDER — HYDRALAZINE 25 MG TABLET
ORAL_TABLET | ORAL | Status: AC
Start: 2024-03-30 — End: 2024-03-30
  Filled 2024-03-30: qty 1

## 2024-03-30 MED ORDER — METHOCARBAMOL 750 MG TABLET
1500.0000 mg | ORAL_TABLET | Freq: Four times a day (QID) | ORAL | Status: DC
Start: 2024-03-30 — End: 2024-03-30
  Administered 2024-03-30: 1500 mg via ORAL

## 2024-03-30 MED ORDER — TRAMADOL 50 MG TABLET
ORAL_TABLET | ORAL | Status: AC
Start: 2024-03-30 — End: 2024-03-30
  Filled 2024-03-30: qty 2

## 2024-03-30 MED ORDER — TRAMADOL 50 MG TABLET
100.0000 mg | ORAL_TABLET | ORAL | Status: AC
Start: 2024-03-30 — End: 2024-03-30
  Administered 2024-03-30: 100 mg via ORAL

## 2024-03-30 MED ORDER — TRAMADOL 50 MG TABLET
1.0000 | ORAL_TABLET | Freq: Four times a day (QID) | ORAL | 0 refills | Status: AC | PRN
Start: 2024-03-30 — End: ?

## 2024-03-30 MED ORDER — METHOCARBAMOL 500 MG TABLET
ORAL_TABLET | ORAL | Status: AC
Start: 2024-03-30 — End: 2024-03-30
  Filled 2024-03-30: qty 3

## 2024-03-30 MED ORDER — HYDRALAZINE 25 MG TABLET
25.0000 mg | ORAL_TABLET | ORAL | Status: AC
Start: 2024-03-30 — End: 2024-03-30
  Administered 2024-03-30: 25 mg via ORAL

## 2024-03-30 NOTE — ED Nurses Note (Signed)
 Sling applied to patient. Tolerated well.

## 2024-03-30 NOTE — Discharge Instructions (Signed)
 May need physical therapy and mri. Return if any concerns

## 2024-03-30 NOTE — ED Nurses Note (Signed)
 Repeat blood pressure 177/99.  Patient states she take her BP medication at night and it is time for it.  States she would like to go ahead and be discharged and take her home meds.  Educated on importance of BP management and follow up for elevated BP. Verbalized understanding.  Verbalized understanding of discharge instructions, rx education, and follow up information. VSS. AVS given to patient.  Left ER with no further complaints.

## 2024-03-30 NOTE — ED Provider Notes (Signed)
 [facility], [place] - Emergency Department  ED Primary Note  History of Present Illness   Karen Gilbert is a 43 y.o. female who had concerns including Arm Pain. Pt states left shoulder pain lifted 30 lb child yesterday. Unsure what happened but hurts to move it denies cp nor sob  Review of Systems   Constitutional: No fever, chills or weakness   Skin: No rash or diaphoresis  HENT: No headaches, or congestion  Eyes: No vision changes or photophobia   Cardio: No chest pain, palpitations or leg swelling   Respiratory: No cough, wheezing or SOB  GI:  No nausea, vomiting or stool changes  GU:  No dysuria, hematuria, or increased frequency  MSK: + muscle aches, joint  pain  Neuro: No seizures, LOC, numbness, tingling, or focal weakness  Psychiatric: No depression, SI or substance abuse  All other systems reviewed and are negative.        Physical Exam   ED Triage Vitals [03/30/24 1901]   BP (Non-Invasive) (!) 187/110   Heart Rate (!) 101   Respiratory Rate 18   Temperature 36.9 C (98.4 F)   SpO2 99 %   Weight 104 kg (230 lb)   Height 1.803 m (5' 11)     Constitutional:  43 y.o. female who appears in no distress. Normal color, no cyanosis.   HENT:   Head: Normocephalic and atraumatic.   Mouth/Throat: Oropharynx is clear and moist.   Eyes: EOMI, PERRL   Neck: Trachea midline. Neck supple.  Cardiovascular: RRR, No murmurs, rubs or gallops. Intact distal pulses.  Pulmonary/Chest: BS equal bilaterally. No respiratory distress. No wheezes, rales or chest tenderness.   Abdominal: Bowel sounds present and normal. Abdomen soft, no tenderness, no rebound and no guarding.  Back: No midline spinal tenderness, no paraspinal tenderness, no CVA tenderness.           Musculoskeletal: No edema+ left shoulder ac area left wrist radial aspect  tenderness ROM limited by pain no deformity.  Skin: warm and dry. No rash, erythema, pallor or cyanosis  Psychiatric: normal mood and affect. Behavior is normal.    Neurological: Patient keenly alert and responsive, easily able to raise eyebrows, facial muscles/expressions symmetric, speaking in fluent sentences, moving all extremities equally and fully, normal gait  Patient Data   Labs Ordered/Reviewed - No data to display  XR SHOULDER LEFT   Final Result by Edi, Radresults In (11/01 1953)   No acute osseous abnormalities in the left shoulder.                Radiologist location ID: [id number]         XR WRIST LEFT   Final Result by Edi, Radresults In (11/01 1953)   No acute osseous abnormalities in the left wrist.                Radiologist location ID: [id number]         XR ELBOW LEFT 2 VIEW   Final Result by Edi, Radresults In (11/01 1955)   No acute osseous abnormalities in the left elbow                Radiologist location ID: [facility]           Medical Decision Making   Diff dx of arm strain. Ligament derangement. Xray neg.   Pt had some pain relief placed in sling cap refill 2 sec . Pt states she has not had her BP meds today  takes them at night encouraged to take them asap.     Medications Ordered/Administered in the ED   methocarbamol (ROBAXIN) tablet (1,500 mg Oral Given 03/30/24 2014)   traMADol  (ULTRAM ) tablet (100 mg Oral Given 03/30/24 2014)   hydrALAZINE (APRESOLINE) tablet (25 mg Oral Given 03/30/24 2027)     Clinical Impression   Left shoulder strain, initial encounter (Primary)   Left wrist sprain, initial encounter   Hypertension, unspecified type       Disposition: Discharged

## 2024-04-01 ENCOUNTER — Ambulatory Visit (HOSPITAL_COMMUNITY)

## 2024-04-05 ENCOUNTER — Other Ambulatory Visit (INDEPENDENT_AMBULATORY_CARE_PROVIDER_SITE_OTHER): Payer: Self-pay

## 2024-04-05 DIAGNOSIS — G5603 Carpal tunnel syndrome, bilateral upper limbs: Secondary | ICD-10-CM

## 2024-04-09 ENCOUNTER — Encounter (HOSPITAL_COMMUNITY): Payer: Self-pay

## 2024-04-12 ENCOUNTER — Ambulatory Visit (HOSPITAL_COMMUNITY): Payer: Self-pay

## 2024-04-19 ENCOUNTER — Ambulatory Visit
Admission: RE | Admit: 2024-04-19 | Discharge: 2024-04-19 | Disposition: A | Source: Ambulatory Visit | Attending: Family

## 2024-04-19 ENCOUNTER — Other Ambulatory Visit: Payer: Self-pay

## 2024-04-19 DIAGNOSIS — M79604 Pain in right leg: Secondary | ICD-10-CM | POA: Insufficient documentation

## 2024-05-09 ENCOUNTER — Ambulatory Visit (HOSPITAL_COMMUNITY)

## 2024-05-14 ENCOUNTER — Ambulatory Visit (INDEPENDENT_AMBULATORY_CARE_PROVIDER_SITE_OTHER): Payer: Self-pay | Admitting: Neurology

## 2024-05-28 ENCOUNTER — Other Ambulatory Visit (HOSPITAL_COMMUNITY): Payer: Self-pay | Admitting: NURSE PRACTITIONER

## 2024-05-28 ENCOUNTER — Encounter (HOSPITAL_COMMUNITY): Payer: Self-pay

## 2024-05-28 DIAGNOSIS — M25512 Pain in left shoulder: Secondary | ICD-10-CM

## 2024-06-25 ENCOUNTER — Other Ambulatory Visit: Payer: Self-pay

## 2024-06-26 ENCOUNTER — Ambulatory Visit

## 2024-06-28 ENCOUNTER — Ambulatory Visit

## 2024-08-06 ENCOUNTER — Ambulatory Visit (HOSPITAL_COMMUNITY)

## 2024-08-06 ENCOUNTER — Ambulatory Visit
